# Patient Record
Sex: Female | Born: 1955 | Race: White | Hispanic: No | Marital: Married | Smoking: Never smoker
Health system: Southern US, Community
[De-identification: ages and names within clinical notes are randomized; demographics above are authoritative.]

## PROBLEM LIST (undated history)

## (undated) DIAGNOSIS — R079 Chest pain, unspecified: Secondary | ICD-10-CM

## (undated) DIAGNOSIS — Z9889 Other specified postprocedural states: Secondary | ICD-10-CM

## (undated) DIAGNOSIS — M797 Fibromyalgia: Secondary | ICD-10-CM

## (undated) DIAGNOSIS — E559 Vitamin D deficiency, unspecified: Secondary | ICD-10-CM

## (undated) DIAGNOSIS — G43909 Migraine, unspecified, not intractable, without status migrainosus: Secondary | ICD-10-CM

## (undated) DIAGNOSIS — R112 Nausea with vomiting, unspecified: Secondary | ICD-10-CM

## (undated) DIAGNOSIS — M810 Age-related osteoporosis without current pathological fracture: Secondary | ICD-10-CM

## (undated) DIAGNOSIS — F909 Attention-deficit hyperactivity disorder, unspecified type: Secondary | ICD-10-CM

## (undated) DIAGNOSIS — F419 Anxiety disorder, unspecified: Secondary | ICD-10-CM

## (undated) DIAGNOSIS — D649 Anemia, unspecified: Secondary | ICD-10-CM

## (undated) HISTORY — DX: Anxiety disorder, unspecified: F41.9

## (undated) HISTORY — PX: APPENDECTOMY: SHX54

## (undated) HISTORY — PX: BREAST BIOPSY: SHX20

## (undated) HISTORY — DX: Chest pain, unspecified: R07.9

---
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
## 1988-01-14 HISTORY — PX: REDUCTION MAMMAPLASTY: SUR839

## 1998-03-20 ENCOUNTER — Other Ambulatory Visit: Admission: RE | Admit: 1998-03-20 | Discharge: 1998-03-20 | Payer: Self-pay | Admitting: Obstetrics and Gynecology

## 1998-06-14 ENCOUNTER — Encounter: Payer: Self-pay | Admitting: Emergency Medicine

## 1998-06-14 ENCOUNTER — Emergency Department (HOSPITAL_COMMUNITY): Admission: EM | Admit: 1998-06-14 | Discharge: 1998-06-14 | Payer: Self-pay | Admitting: Emergency Medicine

## 2000-08-06 ENCOUNTER — Other Ambulatory Visit: Admission: RE | Admit: 2000-08-06 | Discharge: 2000-08-06 | Payer: Self-pay | Admitting: Obstetrics and Gynecology

## 2002-05-10 ENCOUNTER — Encounter: Payer: Self-pay | Admitting: Family Medicine

## 2002-05-10 ENCOUNTER — Ambulatory Visit (HOSPITAL_COMMUNITY): Admission: RE | Admit: 2002-05-10 | Discharge: 2002-05-10 | Payer: Self-pay | Admitting: Family Medicine

## 2002-06-27 ENCOUNTER — Encounter: Admission: RE | Admit: 2002-06-27 | Discharge: 2002-06-27 | Payer: Self-pay | Admitting: Obstetrics and Gynecology

## 2002-06-27 ENCOUNTER — Encounter: Payer: Self-pay | Admitting: Obstetrics and Gynecology

## 2002-08-17 ENCOUNTER — Other Ambulatory Visit: Admission: RE | Admit: 2002-08-17 | Discharge: 2002-08-17 | Payer: Self-pay | Admitting: Obstetrics and Gynecology

## 2002-12-21 ENCOUNTER — Ambulatory Visit (HOSPITAL_COMMUNITY): Admission: RE | Admit: 2002-12-21 | Discharge: 2002-12-21 | Payer: Self-pay | Admitting: Family Medicine

## 2002-12-22 ENCOUNTER — Encounter: Admission: RE | Admit: 2002-12-22 | Discharge: 2002-12-22 | Payer: Self-pay | Admitting: Obstetrics and Gynecology

## 2003-09-30 ENCOUNTER — Inpatient Hospital Stay (HOSPITAL_COMMUNITY): Admission: EM | Admit: 2003-09-30 | Discharge: 2003-10-03 | Payer: Self-pay | Admitting: Emergency Medicine

## 2004-01-03 ENCOUNTER — Other Ambulatory Visit: Admission: RE | Admit: 2004-01-03 | Discharge: 2004-01-03 | Payer: Self-pay | Admitting: Family Medicine

## 2004-11-26 ENCOUNTER — Ambulatory Visit: Admission: RE | Admit: 2004-11-26 | Discharge: 2004-11-26 | Payer: Self-pay | Admitting: Family Medicine

## 2005-05-08 ENCOUNTER — Ambulatory Visit: Payer: Self-pay | Admitting: Cardiology

## 2005-05-20 ENCOUNTER — Ambulatory Visit: Payer: Self-pay

## 2005-05-22 ENCOUNTER — Ambulatory Visit: Payer: Self-pay | Admitting: Cardiology

## 2006-01-13 HISTORY — PX: ABDOMINAL HYSTERECTOMY: SHX81

## 2006-01-19 ENCOUNTER — Other Ambulatory Visit: Admission: RE | Admit: 2006-01-19 | Discharge: 2006-01-19 | Payer: Self-pay | Admitting: Family Medicine

## 2006-01-20 ENCOUNTER — Encounter: Admission: RE | Admit: 2006-01-20 | Discharge: 2006-01-20 | Payer: Self-pay | Admitting: Family Medicine

## 2008-03-21 ENCOUNTER — Encounter: Admission: RE | Admit: 2008-03-21 | Discharge: 2008-03-21 | Payer: Self-pay | Admitting: Family Medicine

## 2009-01-13 HISTORY — PX: FINGER SURGERY: SHX640

## 2009-01-26 ENCOUNTER — Encounter: Admission: RE | Admit: 2009-01-26 | Discharge: 2009-01-26 | Payer: Self-pay | Admitting: Family Medicine

## 2009-02-21 ENCOUNTER — Ambulatory Visit (HOSPITAL_COMMUNITY): Admission: EM | Admit: 2009-02-21 | Discharge: 2009-02-22 | Payer: Self-pay | Admitting: Emergency Medicine

## 2009-04-10 ENCOUNTER — Encounter: Admission: RE | Admit: 2009-04-10 | Discharge: 2009-04-10 | Payer: Self-pay | Admitting: Family Medicine

## 2009-06-29 ENCOUNTER — Ambulatory Visit (HOSPITAL_COMMUNITY): Admission: RE | Admit: 2009-06-29 | Discharge: 2009-06-29 | Payer: Self-pay | Admitting: Gastroenterology

## 2010-05-31 NOTE — H&P (Signed)
[name], [name]                 ACCOUNT NO.:  [id number]   MEDICAL RECORD NO.:  [id number]          PATIENT TYPE:  INP   LOCATION:  0440                         FACILITY:  [facility]   PHYSICIAN:  Melissa L. Ladona Ridgel, MD  DATE OF BIRTH:  04-02-1955   DATE OF ADMISSION:  09/30/2003  DATE OF DISCHARGE:                                HISTORY & PHYSICAL   CHIEF COMPLAINT:  Abdominal pain since 3 a.m.   PRIMARY CARE PHYSICIAN:  Dr. Merri Brunette.   HISTORY OF PRESENT ILLNESS:  The patient is a 55 year old white female who  was awakened at 3 a.m. with severe abdominal pain.  She states that the pain  comes in waves.  She could not give me a number but identified it as  starting in the central part of her abdomen and extending up into her chest.  She stated that nothing exacerbated it and nothing relieved it.  The patient  relates that prior to developing the pain she ate at [facility].  She states  she had a hamburger, and fried onion rings.  She states that she has had no  seafood recently, no ill contacts and no sick pet contacts.  The patient  does teach first grade.   PAST MEDICAL HISTORY:  She states she has hormonal problems for which she  takes Wellbutrin.   PAST SURGICAL HISTORY:  She had a hysterectomy eight years prior and this  was secondary to endometriosis.  She has also had an appendectomy.   ALLERGIES:  Codeine which cause hives.   MEDICATIONS:  Her only medications at this time is Wellbutrin.  The dose is  unknown but she does get it from [facility] which is currently closed.   SOCIAL HISTORY:  She denies tobacco, ethanol or illicit drug use.   FAMILY HISTORY:  Her Mom is living with possible diverticular disease. Her  Dad is deceased secondary to MI.  She is married, has a 25 and a 54 year old  and again she teaches school for the first grade.   REVIEW OF SYSTEMS:  Prior to the admission, no fever, chills.  She did have  some nausea but no vomiting.  However, on  arrival into the emergency  department, she was found to be febrile.  She vomited x1 after drinking the  contrast for CT scan.  She denies any chest pain, shortness of breath,  melena, hematochezia, dysuria or blood in her urine.  All other review of  systems are negative.   PHYSICAL EXAMINATION:  VITAL SIGNS:  Temperature is 102.4, blood pressure is  105/49, pulse was 100 and respiratory rate 18, saturations was 97%.  GENERAL:  She is in minimal distress secondary to nausea.  HEENT:  She is  normocephalic, atraumatic.  Pupils equal, round, reactive to light.  Extraocular muscles are intact.  Mucous membranes are moist.  NECK:  Supple.  There is no JVD.  No lymph nodes.  There are no oral lesions.  CHEST:  Clear  to auscultation.  There is no rhonchi, rales or wheezing.  CARDIOVASCULAR:  There is a regular rate  and rhythm, positive S1, S2.  No S3, S4.  No  murmurs, rubs or gallops.  ABDOMEN:  Soft, minimally tender in the  epigastrium with no guarding or rebound.  EXTREMITIES:  Shows 2+ pulses with  no edema.  NEUROLOGICAL:  She is nonfocal.   LABORATORY DATA:  Laboratories reveal a urinalysis with trace leukocyte  esterase.  Her white count is 12.4, hemoglobin of 13.6, hematocrit of 40.2  and platelets of 249,000.  She has 86% neutrophilia.  Liver function tests  were within normal limits.  Amylase and lipase were within normal limits.  Her sodium is 135, potassium is 3.7, chloride is 104, CO2 is 25, BUN is 12,  creatinine is 1.  Her glucose is 97.  CT scan of the abdomen showed mildly  thickened bowel wall.  In the small bowel, ultrasound showed preliminarily  showed no gallbladder disease.   ASSESSMENT AND PLAN:  This is a 55 year old white female with the acute  onset of abdominal pain which had progressed on to fever with worsening  abdominal pain and one episode of nausea with vomiting.  She will be  admitted to the general medical floor and treated for what appears to be a   colitis of unknown etiology on her CT scan.  1.  Gastrointestinal.  She can have clears if this is tolerated.  However,      at this time n.p.o. status would be recommended.  We will continue her      Cipro IV and aggressively hydrate her.  We will also be checking stool      cultures.  2.  Genitourinary.  There is trace leukocyte esterase in her urine.  This      will be covered by the Cipro.  Will continue to IV hydrate and await her      formal cultures.  3.  Pulmonary.  Stable.  4.  Cardiovascular.  She does have some tachycardia but this is related with      fever.  I will attempt to check an EKG just to assure that this remains      sinus tachycardia.  5.  Psychiatric.  The patient states that she has some hormonal problems for      which she is on Wellbutrin.  We will hold that at this time and resume      it when she is able to take oral medications.  6.  For deep venous thrombosis prophylaxis, at this time the patient can      ambulate.     Meli   MLT/MEDQ  D:  10/02/2003  T:  10/02/2003  Job:  [id number]

## 2010-05-31 NOTE — Discharge Summary (Signed)
[name], [name]                 ACCOUNT NO.:  [id number]   MEDICAL RECORD NO.:  [id number]          PATIENT TYPE:  INP   LOCATION:  0440                         FACILITY:  [facility]   PHYSICIAN:  Corinna L. Lendell Caprice, MDDATE OF BIRTH:  08/04/1955   DATE OF ADMISSION:  09/30/2003  DATE OF DISCHARGE:  10/03/2003                                 DISCHARGE SUMMARY   DIAGNOSES:  1.  Enteritis, suspect infectious.  2.  Acute cephalgia.   DISCHARGE MEDICATIONS:  Cipro 500 mg p.o. b.i.d. for two more days.   ACTIVITY:  She may return to work in three days.   DIET:  As tolerated.   CONDITION ON DISCHARGE:  Stable.   Follow up with Dr. Katrinka Blazing as needed.   CONSULTATIONS:  None.   PROCEDURES:  None.   PERTINENT LABORATORY DATA:  Blood cultures negative.  UA negative.  Complete  metabolic panel negative.  Tylenol level less than 10.  White blood cell  count on admission was 12.4 with 86% neutrophils, 6% lymphocytes.  The rest  of the CBC was unremarkable.  At discharge her white count was 4.4.   SPECIAL STUDIES AND RADIOLOGY:  Ultrasound of the abdomen was negative.  CT  of the abdomen and pelvis showed thickening of the distal small bowel loops  with adjacent mesenteric inflammation, nonspecific, consider Crohn's versus  infectious enteritis versus endometriosis.  Small 9 mm paracecal lymph node  representing reactive lymph node.  Previous hysterectomy.   HISTORY AND HOSPITAL COURSE:  Ms. Darsey is a pleasant 55 year old white  female patient of Dr. Merri Brunette, who presented to the emergency room  with severe abdominal pain.  It was starting in the midabdomen, extending  upward.  She had had hamburger recently.  She had a temperature of 102.4,  pulse was 100, blood pressure 105/49, respiratory rate 18.  The patient's  abdominal exam was soft, no guarding or rebound.  She had some minimal  epigastric tenderness.   The patient was started empirically on IV Cipro.  She was made NPO,  given  pain medications and antipyretics.  Her workup was negative for gallbladder  disease.  Her fever resolved, as did her pain, while on empiric  ciprofloxacin,  her diet was advanced, and she had normal vital signs, normal labs, was  feeling much better and tolerating a regular diet at the time of discharge.  This is most likely infectious; however, if she has a recurrence, consider  endoscopy to rule out Crohn's disease.     Cori   CLS/MEDQ  D:  10/03/2003  T:  10/04/2003  Job:  [id number]   cc:   Dario Guardian, M.D.  [address]  [place]  [place] [address]  Fax: [phone]

## 2010-07-16 ENCOUNTER — Other Ambulatory Visit: Payer: Self-pay | Admitting: Family Medicine

## 2010-07-16 ENCOUNTER — Other Ambulatory Visit (HOSPITAL_COMMUNITY)
Admission: RE | Admit: 2010-07-16 | Discharge: 2010-07-16 | Disposition: A | Discharge status: Home / Self Care | Payer: BC Managed Care – PPO | Source: Ambulatory Visit | Attending: Family Medicine | Admitting: Family Medicine

## 2010-07-16 DIAGNOSIS — Z01419 Encounter for gynecological examination (general) (routine) without abnormal findings: Secondary | ICD-10-CM | POA: Insufficient documentation

## 2010-08-26 ENCOUNTER — Ambulatory Visit
Admission: RE | Admit: 2010-08-26 | Discharge: 2010-08-26 | Disposition: A | Discharge status: Home / Self Care | Payer: BC Managed Care – PPO | Source: Ambulatory Visit | Attending: Family Medicine | Admitting: Family Medicine

## 2010-08-26 ENCOUNTER — Other Ambulatory Visit: Payer: Self-pay | Admitting: Family Medicine

## 2010-08-26 DIAGNOSIS — R51 Headache: Secondary | ICD-10-CM

## 2011-04-23 ENCOUNTER — Encounter (HOSPITAL_COMMUNITY): Payer: Self-pay | Admitting: *Deleted

## 2011-04-23 ENCOUNTER — Emergency Department (HOSPITAL_COMMUNITY): Payer: BC Managed Care – PPO

## 2011-04-23 ENCOUNTER — Observation Stay (HOSPITAL_COMMUNITY)
Admission: EM | Admit: 2011-04-23 | Discharge: 2011-04-24 | Disposition: A | Discharge status: Home / Self Care | Payer: BC Managed Care – PPO | Source: Ambulatory Visit | Attending: Internal Medicine | Admitting: Internal Medicine

## 2011-04-23 DIAGNOSIS — IMO0001 Reserved for inherently not codable concepts without codable children: Secondary | ICD-10-CM | POA: Insufficient documentation

## 2011-04-23 DIAGNOSIS — R079 Chest pain, unspecified: Principal | ICD-10-CM | POA: Insufficient documentation

## 2011-04-23 DIAGNOSIS — F411 Generalized anxiety disorder: Secondary | ICD-10-CM | POA: Insufficient documentation

## 2011-04-23 DIAGNOSIS — F419 Anxiety disorder, unspecified: Secondary | ICD-10-CM

## 2011-04-23 HISTORY — DX: Migraine, unspecified, not intractable, without status migrainosus: G43.909

## 2011-04-23 HISTORY — DX: Anemia, unspecified: D64.9

## 2011-04-23 HISTORY — DX: Vitamin D deficiency, unspecified: E55.9

## 2011-04-23 HISTORY — DX: Fibromyalgia: M79.7

## 2011-04-23 HISTORY — DX: Other specified postprocedural states: Z98.890

## 2011-04-23 HISTORY — DX: Attention-deficit hyperactivity disorder, unspecified type: F90.9

## 2011-04-23 HISTORY — DX: Other specified postprocedural states: R11.2

## 2011-04-23 LAB — COMPREHENSIVE METABOLIC PANEL
AST: 16 U/L (ref 0–37)
Alkaline Phosphatase: 66 U/L (ref 39–117)
CO2: 25 mEq/L (ref 19–32)
Calcium: 9.4 mg/dL (ref 8.4–10.5)
Chloride: 106 mEq/L (ref 96–112)
Creatinine, Ser: 0.78 mg/dL (ref 0.50–1.10)
Glucose, Bld: 88 mg/dL (ref 70–99)
Potassium: 3.6 mEq/L (ref 3.5–5.1)
Total Protein: 6.6 g/dL (ref 6.0–8.3)

## 2011-04-23 LAB — CBC
HCT: 35.4 % — ABNORMAL LOW (ref 36.0–46.0)
Hemoglobin: 12 g/dL (ref 12.0–15.0)
MCH: 31 pg (ref 26.0–34.0)
MCV: 91.5 fL (ref 78.0–100.0)
Platelets: 212 10*3/uL (ref 150–400)
RBC: 3.87 MIL/uL (ref 3.87–5.11)
RDW: 12.1 % (ref 11.5–15.5)

## 2011-04-23 LAB — CARDIAC PANEL(CRET KIN+CKTOT+MB+TROPI): Relative Index: INVALID (ref 0.0–2.5)

## 2011-04-23 LAB — PROTIME-INR: INR: 1.06 (ref 0.00–1.49)

## 2011-04-23 LAB — DIFFERENTIAL
Eosinophils Absolute: 0.1 10*3/uL (ref 0.0–0.7)
Monocytes Absolute: 0.5 10*3/uL (ref 0.1–1.0)

## 2011-04-23 MED ORDER — AMPHETAMINE-DEXTROAMPHETAMINE 10 MG PO TABS: 10.0000 mg | ORAL_TABLET | Freq: Every day | ORAL | Status: DC

## 2011-04-23 MED ORDER — NITROGLYCERIN 2 % TD OINT
1.0000 [in_us] | TOPICAL_OINTMENT | Freq: Once | TRANSDERMAL | Status: AC
  Administered 2011-04-23: 1 [in_us] via TOPICAL
  Filled 2011-04-23: qty 1

## 2011-04-23 MED ORDER — ENOXAPARIN SODIUM 40 MG/0.4ML ~~LOC~~ SOLN
40.0000 mg | SUBCUTANEOUS | Status: DC
  Administered 2011-04-23: 40 mg via SUBCUTANEOUS
  Filled 2011-04-23 (×3): qty 0.4

## 2011-04-23 MED ORDER — ALPRAZOLAM 0.25 MG PO TABS
0.2500 mg | ORAL_TABLET | Freq: Two times a day (BID) | ORAL | Status: DC | PRN
  Administered 2011-04-23: 0.25 mg via ORAL
  Filled 2011-04-23: qty 1

## 2011-04-23 MED ORDER — ZOLPIDEM TARTRATE 5 MG PO TABS
2.5000 mg | ORAL_TABLET | Freq: Every day | ORAL | Status: DC
  Administered 2011-04-23: 2.5 mg via ORAL
  Filled 2011-04-23: qty 1

## 2011-04-23 MED ORDER — SODIUM CHLORIDE 0.9 % IV SOLN: 250.0000 mL | INTRAVENOUS | Status: DC | PRN

## 2011-04-23 MED ORDER — ACETAMINOPHEN 325 MG PO TABS
650.0000 mg | ORAL_TABLET | ORAL | Status: DC | PRN
  Administered 2011-04-23: 650 mg via ORAL
  Filled 2011-04-23: qty 2

## 2011-04-23 MED ORDER — NITROGLYCERIN 0.4 MG SL SUBL: 0.4000 mg | SUBLINGUAL_TABLET | SUBLINGUAL | Status: DC | PRN

## 2011-04-23 MED ORDER — SODIUM CHLORIDE 0.9 % IJ SOLN
3.0000 mL | Freq: Two times a day (BID) | INTRAMUSCULAR | Status: DC
  Administered 2011-04-23: 3 mL via INTRAVENOUS

## 2011-04-23 MED ORDER — ONDANSETRON HCL 4 MG/2ML IJ SOLN
4.0000 mg | Freq: Four times a day (QID) | INTRAMUSCULAR | Status: DC | PRN
  Administered 2011-04-23: 4 mg via INTRAVENOUS
  Filled 2011-04-23: qty 2

## 2011-04-23 MED ORDER — SODIUM CHLORIDE 0.9 % IJ SOLN: 3.0000 mL | INTRAMUSCULAR | Status: DC | PRN

## 2011-04-23 MED ORDER — MORPHINE SULFATE 2 MG/ML IJ SOLN
1.0000 mg | INTRAMUSCULAR | Status: DC | PRN
  Administered 2011-04-23: 1 mg via INTRAVENOUS
  Filled 2011-04-23: qty 1

## 2011-04-23 MED ORDER — CHOLECALCIFEROL 10 MCG (400 UNIT) PO TABS
5000.0000 [IU] | ORAL_TABLET | Freq: Every day | ORAL | Status: DC
  Filled 2011-04-23 (×3): qty 13

## 2011-04-23 NOTE — ED Notes (Signed)
Pt c/o mild headache. Pt requested tylenol.

## 2011-04-23 NOTE — ED Notes (Signed)
Nitro patch removed per MD

## 2011-04-23 NOTE — ED Notes (Signed)
EMS-pt went to [facility] cardiology today for left sided chest pain radiating into shoulder and neck x 4 days, pt states pain constant but increases and decreases in severity. 324asa and 2nitros given en route. Pt states pain from 7/10- to 4/10 with 2 nitros. 20g(L)AC. Pt sent over for further eval.

## 2011-04-23 NOTE — ED Notes (Signed)
Attempted calling report to floor, nurse in report, will return call.

## 2011-04-23 NOTE — ED Provider Notes (Signed)
History     CSN: [id number]  Arrival date & time 04/23/11  1430   First MD Initiated Contact with Patient 04/23/11 1504      Chief Complaint  Patient presents with  . Chest Pain    (Consider location/radiation/quality/duration/timing/severity/associated sxs/prior treatment) HPI  Patient presents via EMS from her doctor's office.  Patient relates for the past 5-7 days she's been having a left-sided chest pressure that radiates into her left arm down to her elbow, up into her left neck, and into her back. She states the pain has been intermittent and lasting for several hours. She states this morning she woke up with the pain. She denies nausea, vomiting, diaphoresis, shortness of breath. She states nothing makes it feel better, nothing makes it feel worse. She went to her doctor's office today and she was given nitroglycerin x2 which improved her pain from a level 7/10 to a 2/10 currently. She also was given 4 baby aspirin to chew. She states prior to this past week she's never had these symptoms before. She denies being under any extra stress. She denies any change in her activity.  PCP Dr Erby Pian  Past Medical History  Diagnosis Date  . Migraine headache   . Anemia   . ADHD (attention deficit hyperactivity disorder)   . Fibromyalgia   . Vitamin D insufficiency     Past Surgical History  Procedure Date  . Tonsillectomy   . Appendectomy   . Breast surgery     reduction  . Abdominal hysterectomy     History reviewed. No pertinent family history.  History  Substance Use Topics  . Smoking status: Never Smoker   . Smokeless tobacco: Not on file  . 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    Allergies  Codeine  Home Medications   Current Outpatient Rx  Name Route Sig Dispense Refill  .  AMPHETAMINE-DEXTROAMPHETAMINE 10 MG PO TABS Oral Take 10 mg by mouth daily.    Marland Kitchen VITAMIN D (CHOLECALCIFEROL) PO Oral Take 5,000 Units by mouth daily.    Marland Kitchen ZOLPIDEM TARTRATE 5 MG PO TABS Oral Take 2.5 mg by mouth at bedtime.      BP 128/74  Pulse 82  Temp(Src) 97.4 F (36.3 C) (Oral)  Resp 16  SpO2 100%  Vital signs normal    Physical Exam  Nursing note and vitals reviewed. Constitutional: She is oriented to person, place, and time. She appears well-developed and well-nourished.  Non-toxic appearance. She does not appear ill. No distress.  HENT:  Head: Normocephalic and atraumatic.  Right Ear: External ear normal.  Left Ear: External ear normal.  Nose: Nose normal. No mucosal edema or rhinorrhea.  Mouth/Throat: Oropharynx is clear and moist and mucous membranes are normal. No dental abscesses or uvula swelling.  Eyes: Conjunctivae and EOM are normal. Pupils are equal, round, and reactive to light.  Neck: Normal range of motion and full passive range of motion without pain. Neck supple.  Cardiovascular: Normal rate, regular rhythm and normal heart sounds.  Exam reveals no gallop and no friction rub.   No murmur heard. Pulmonary/Chest: Effort normal and breath sounds normal. No respiratory distress. She has no wheezes. She has no rhonchi. She has  no rales. She exhibits no tenderness and no crepitus.  Abdominal: Soft. Normal appearance and bowel sounds are normal. She exhibits no distension. There is no tenderness. There is no rebound and no guarding.  Musculoskeletal: Normal range of motion. She exhibits no edema and no tenderness.       Moves all extremities well.   Neurological: She is alert and oriented to person, place, and time. She has normal strength. No cranial nerve deficit.  Skin: Skin is warm, dry and intact. No rash noted. No erythema. No pallor.  Psychiatric: She has a normal mood and affect. Her speech is normal and behavior is normal. Her mood appears not anxious.     ED Course  Procedures (including critical care time)   Medications  nitroGLYCERIN (NITROGLYN) 2 % ointment 1 inch (1 inch Topical Given 04/23/11 1536)   Recheck 16:30 states her pain isn't gone.   16:31 Pt and husband states she saw Dr Myrtis Ser in 2007 for "inflammation". States she had a stress test.   16:43 Dr Ladona Ridgel admit to observation   Results for orders placed during the hospital encounter of 04/23/11  CBC      Component Value Range   WBC 7.4  4.0 - 10.5 (K/uL)   RBC 3.87  3.87 - 5.11 (MIL/uL)   Hemoglobin 12.0  12.0 - 15.0 (g/dL)   HCT 09.8 (*) 11.9 - 46.0 (%)   MCV 91.5  78.0 - 100.0 (fL)   MCH 31.0  26.0 - 34.0 (pg)   MCHC 33.9  30.0 - 36.0 (g/dL)   RDW 14.7  82.9 - 56.2 (%)   Platelets 212  150 - 400 (K/uL)  DIFFERENTIAL      Component Value Range   Neutrophils Relative 71  43 - 77 (%)   Lymphocytes Relative 21  12 - 46 (%)   Monocytes Relative 7  3 - 12 (%)   Eosinophils Relative 1  0 - 5 (%)   Basophils Relative 0  0 - 1 (%)   Neutro Abs 5.2  1.7 - 7.7 (K/uL)   Lymphs Abs 1.6  0.7 - 4.0 (K/uL)   Monocytes Absolute 0.5  0.1 - 1.0 (K/uL)   Eosinophils Absolute 0.1  0.0 - 0.7 (K/uL)   Basophils Absolute 0.0  0.0 - 0.1 (K/uL)   Smear Review MORPHOLOGY UNREMARKABLE    COMPREHENSIVE METABOLIC PANEL      Component Value Range   Sodium 142  135 - 145 (mEq/L)   Potassium 3.6  3.5 - 5.1 (mEq/L)   Chloride 106  96 - 112 (mEq/L)   CO2 25  19 - 32 (mEq/L)   Glucose, Bld 88  70 - 99 (mg/dL)   BUN 18  6 - 23 (mg/dL)   Creatinine, Ser 1.30  0.50 - 1.10 (mg/dL)   Calcium 9.4  8.4 - 86.5 (mg/dL)   Total Protein 6.6  6.0 - 8.3 (g/dL)   Albumin 4.0  3.5 - 5.2 (g/dL)   AST 16  0 - 37 (U/L)   ALT 10  0 - 35 (U/L)   Alkaline Phosphatase 66  39 - 117 (U/L)   Total Bilirubin 0.4  0.3 - 1.2 (mg/dL)   GFR calc non Af Amer >90  >90 (mL/min)   GFR calc Af Amer >90  >90 (mL/min)  D-DIMER, QUANTITATIVE      Component Value Range   D-Dimer, Quant 0.28  0.00 - 0.48 (ug/mL-FEU)   APTT      Component Value Range  aPTT 32  24 - 37 (seconds)  PROTIME-INR      Component Value Range   Prothrombin Time 14.0  11.6 - 15.2 (seconds)   INR 1.06  0.00 - 1.49   POCT I-STAT TROPONIN I      Component Value Range   Troponin i, poc 0.00  0.00 - 0.08 (ng/mL)   Comment 3            Laboratory interpretation all normal  Dg Chest Port 1 View  04/23/2011  *RADIOLOGY REPORT*  Clinical Data: Chest pain.  PORTABLE CHEST - 1 VIEW  Comparison: 03/21/2008  Findings: Low lung volumes with bibasilar atelectasis.  Heart is normal size.  No effusions.  No acute bony abnormality.  IMPRESSION: Bibasilar atelectasis.  Original Report Authenticated By: Cyndie Chime, M.D.  .    Date: 04/23/2011  Rate: 79  Rhythm: normal sinus rhythm and premature ventricular contractions (PVC)  QRS Axis: normal  Intervals: normal  ST/T Wave abnormalities: normal  Conduction Disutrbances:none  Narrative Interpretation: PRWP  Old EKG Reviewed: none available    1. Chest pain     Plan admission to observation   Devoria Albe, MD, FACEP   MDM          Ward Givens, MD 04/25/11 1730

## 2011-04-23 NOTE — Progress Notes (Signed)
Pt. Complained of pressure in her chest, radiating to her left upper back, pain 6/10. Pt also had an headache 7/10 from nitro paste removed earlier while in ED. EKG was done, VS stable. B/P 133/79, HR 77, O2 sats 100 on RA. Pt. Was given xanax for anxiety. MD on all notified and gave order to give morphine 1mg  prn, order carried out. Pt now resting in bed and states she feels better, rating pain 2/10, which she says is what it usually is. Charge nurse notified, will continue to monitor patient.

## 2011-04-23 NOTE — Progress Notes (Signed)
Pt reports chest pain 0/10, with relief. Will continue to monitor.

## 2011-04-23 NOTE — Consult Note (Signed)
Cardiology Consult Note   Patient ID: [name] MRN: [id number], DOB/AGE: 03-05-1955   Admit date: 04/23/2011 Date of Consult: 04/23/2011  Primary Physician: Allean Found, MD, MD Primary Cardiologist: Jerral Bonito, MD (seen in 2007)  Pt. Profile: [name] is a 56yo Caucasian female with no prior cardiac history and PMHx significant for anxiety, family history of CAD and migraines who presents to [facility] ED for chest pain.   Patient had an exercise Myoview in 2007 without evidence of ischemia.   Reason for consult: evaluation/management of chest pain  Problem List: Past Medical History  Diagnosis Date  . Migraine headache   . Anemia   . ADHD (attention deficit hyperactivity disorder)   . Fibromyalgia   . Vitamin D insufficiency     Past Surgical History  Procedure Date  . Tonsillectomy   . Appendectomy   . Breast surgery     reduction  . Abdominal hysterectomy      Allergies:  Allergies  Allergen Reactions  . Codeine Other (See Comments)    hallucinations    HPI:   She reports experiencing L-sided chest pain five days ago described as "pressure" and fullness and also sharp pain, intermittent lasting for hours at a time, increasing in frequency and radiating to L shoulder, arm and neck. She endorses palpitations. Occuring mostly at rest throughout the day. No aggravating factors. No alleviating factors. Denied chest pain after eating. She denies diaphoresis, lightheadedness, shortness of breath, DOE, recent decrease in activity level, orthopnea, PND, LE edema, fevers, n/v, bowel or bladder changes, chills, cough, new stressors, sick contacts or extended travel.   Upon ED arrival, EKG reveals sinus rhythm without ischemic changes. POC TnI WNL. CXR with mild bibasilar atelectasis, otherwise no acute abnormalities noted. VSS. CBC and BMET essentially WNL. D-dimer WNL. She was given NTG SL with improvement in her chest pain. She was started on NTG transdermal with  minimal effect. She will be transferred to observation per chest pain protocol.  Home Medications: Prior to Admission medications   Medication Sig Start Date End Date Taking? Authorizing Provider  amphetamine-dextroamphetamine (ADDERALL) 10 MG tablet Take 10 mg by mouth daily.   Yes Historical Provider, MD  VITAMIN D, CHOLECALCIFEROL, PO Take 5,000 Units by mouth daily.   Yes Historical Provider, MD  zolpidem (AMBIEN) 5 MG tablet Take 2.5 mg by mouth at bedtime.   Yes Historical Provider, MD    Inpatient Medications:     . nitroGLYCERIN  1 inch Topical Once    (Not in a hospital admission)  Family History  Problem Relation Age of Onset  . Heart attack Father 13  . Heart attack Paternal Grandfather 50  . Arrhythmia Mother     Atrial fibrillation     History   Social History  . Marital Status: Married    Spouse Name: N/A    Number of Children: 2  . Years of Education: N/A   Occupational History  . Teacher    Social History Main Topics  . Smoking status: Never Smoker   . Smokeless tobacco: Not on file  . Alcohol Use: No  . Drug Use: No  . Sexually Active: Not on file   Other Topics Concern  . Not on file   Social History Narrative   Lives in [place], [place] with husband.      Review of Systems: General: negative for chills, fever, night sweats or weight changes.  Cardiovascular: positive for chest pain, palpitations, negative dyspnea on exertion, edema,  orthopnea, paroxysmal nocturnal dyspnea or shortness of breath Dermatological: negative for rash Respiratory: negative for cough or wheezing Urologic: negative for hematuria Abdominal: negative for nausea, vomiting, diarrhea, bright red blood per rectum, melena, or hematemesis Neurologic: negative for visual changes, syncope, or dizziness All other systems reviewed and are otherwise negative except as noted above.  Physical Exam: Blood pressure 128/74, pulse 82, temperature 97.4 F (36.3 C), temperature  source Oral, resp. rate 16, SpO2 100.00%.   General: Well developed, well nourished, in no acute distress. Head: Normocephalic, atraumatic, sclera non-icteric, no xanthomas, nares are without discharge.  Neck: Negative for carotid bruits. JVD not elevated. Lungs: Clear bilaterally to auscultation without wheezes, rales, or rhonchi. Breathing is unlabored. Heart: RRR with S1 S2. No murmurs, rubs, or gallops appreciated. Abdomen: Soft, non-tender, non-distended with normoactive bowel sounds. No hepatomegaly. No rebound/guarding. No obvious abdominal masses. Msk:  Strength and tone appears normal for age. Extremities: No clubbing, cyanosis or edema.  Distal pedal pulses are 2+ and equal bilaterally. Neuro: Alert and oriented X 3. Moves all extremities spontaneously. Psych:  Responds to questions appropriately with a normal affect.  Labs: Recent Labs  Basename 04/23/11 1519   WBC 7.4   HGB 12.0   HCT 35.4*   MCV 91.5   PLT 212    Recent Labs  Basename 04/23/11 1519   DDIMER 0.28    Lab 04/23/11 1519  NA 142  K 3.6  CL 106  CO2 25  BUN 18  CREATININE 0.78  CALCIUM 9.4  PROT 6.6  BILITOT 0.4  ALKPHOS 66  ALT 10  AST 16  AMYLASE --  LIPASE --  GLUCOSE 88    Radiology/Studies: Dg Chest Port 1 View  04/23/2011  *RADIOLOGY REPORT*  Clinical Data: Chest pain.  PORTABLE CHEST - 1 VIEW  Comparison: 03/21/2008  Findings: Low lung volumes with bibasilar atelectasis.  Heart is normal size.  No effusions.  No acute bony abnormality.  IMPRESSION: Bibasilar atelectasis.  Original Report Authenticated By: Cyndie Chime, M.D.    EKG: NSR, 79 bpm, occasional PVCs, no ST-T wave changes  ASSESSMENT AND PLAN:   [name] is a 56yo Caucasian female with no prior cardiac history and PMHx significant for anxiety, family history of CAD and migraines who presents to [facility] ED for chest pain.   1. Chest pain- atypical for cardiac source. Patient had a normal exercise Myoview in 2007. She does  have a family history of CAD. She reports intermittent L-sided chest pain described as pressure and sharp radiating to her L shoulder and arm for the past week, nonexertional, with associated palpitations. No prior episodes of chest pain, DOE or decrease in activity level. EKG without ischemic changes. Initial POC TnI WNL. Pain was relieved with NTG SL. The patient has one cardiac risk factor in her family history. She has had a normal exercise Myoview in 2007.   - Agree with observation  - Cycle cardiac enzymes  - If rules out, would plan for outpatient stress test. Should this return non-ischemic, may benefit from a trial of a long-acting nitrate or CCB given history of migraines. If continues to have chest pain tomorrow AM, may consider cardiac CT.  Signed, R. Hurman Horn, PA-C 04/23/2011, 5:25 PM    Cardiology Attending  Patient seen and independently examined. I have reviewed the findings of Mr. Arguello's note and concur. She has a h/o hysterectomy at age 55 but no other cardiac risk factors and was admitted with sscp radiating into  the side. No sob or other symptoms. He pain was markedly improved with slntg. She now has a headache. Her exam is unremarkable and ECG and initial labs normal as well. Will admit for a 23 hour observation and r/o for MI. Additional testing depends on the results of her blood work, tele and ECG's.  She will need a treadmill stress test if she rules out. Lewayne Bunting, M.D.

## 2011-04-23 NOTE — ED Notes (Addendum)
Pt c/o left sided cp, shoulder and neck pain x4 days. States pain comes and goes, does not inc with movement or palpation. NAD noted at this time. Pt states that she feels "really good, the nitro helped. I barely feel the pain." Denies sob, dizziness, n/v, diaphoresis

## 2011-04-24 LAB — CARDIAC PANEL(CRET KIN+CKTOT+MB+TROPI)
Relative Index: INVALID (ref 0.0–2.5)
Total CK: 50 U/L (ref 7–177)
Total CK: 39 U/L (ref 7–177)
Troponin I: <0.3 ng/mL (ref <0.30)

## 2011-04-24 LAB — LIPID PANEL: LDL Cholesterol: 115 mg/dL — ABNORMAL HIGH (ref 0–99)

## 2011-04-24 LAB — BASIC METABOLIC PANEL
BUN: 19 mg/dL (ref 6–23)
Calcium: 9 mg/dL (ref 8.4–10.5)
Creatinine, Ser: 0.8 mg/dL (ref 0.50–1.10)
GFR calc non Af Amer: 81 mL/min — ABNORMAL LOW (ref >90)
Glucose, Bld: 127 mg/dL — ABNORMAL HIGH (ref 70–99)

## 2011-04-24 LAB — CBC
HCT: 36.6 % (ref 36.0–46.0)
Hemoglobin: 12.4 g/dL (ref 12.0–15.0)
MCH: 30.9 pg (ref 26.0–34.0)
MCHC: 33.9 g/dL (ref 30.0–36.0)
RDW: 12.1 % (ref 11.5–15.5)

## 2011-04-24 MED ORDER — PROMETHAZINE HCL 25 MG/ML IJ SOLN
12.5000 mg | Freq: Four times a day (QID) | INTRAMUSCULAR | Status: DC | PRN
  Administered 2011-04-24: 12.5 mg via INTRAVENOUS
  Filled 2011-04-24: qty 1

## 2011-04-24 NOTE — Progress Notes (Signed)
UR Completed. Simmons, Avion Kutzer F [phone]  

## 2011-04-24 NOTE — Progress Notes (Signed)
PRN for nausea and vomiting was given at 0210, pt just vomited again but states feels better. Will continue to monitor.

## 2011-04-24 NOTE — Progress Notes (Signed)
Pt discharge instructions and patient education complete. IV site d/c. Site WNL. No s/s of distress. D/C home via wheelchair with husband. Dion Saucier

## 2011-04-24 NOTE — Progress Notes (Signed)
Pt. Vomited, pt received Zofran IV. Pt threw up on two separate occasions after that at 0030 and 0115. MD on call notified, gave order for phenergan IV, order carried out.

## 2011-04-24 NOTE — Discharge Summary (Signed)
CARDIOLOGY DISCHARGE SUMMARY   Patient ID: JONIYA BOBERG MRN: [id number] DOB/AGE: [birth year] 56 y.o.  Admit date: 04/23/2011 Discharge date: 04/24/2011  Primary Discharge Diagnosis:  Chest pain  Secondary Discharge Diagnosis:  Patient Active Problem List  Diagnoses  . Chest pain  . Anxiety   Procedures: CXR  Hospital Course: Ms Belmonte is a 56 year old female with no history of CAD although she has had a previous stress test. She had chest pain and came to the hospital where she was admitted for further evaluation and treatment.   Her cardiac enzymes were negative for MI. Her chest pain improved. She had some nausea but this is a recurrent problem for her and symptomatic treatment was used.   On 04/24/2011, Ms Meadow was ambulating without chest pain or SOB and considered stable for discharge, to follow up as an outpatient.   Labs:   Lab Results  Component Value Date   WBC 6.9 04/24/2011   HGB 12.4 04/24/2011   HCT 36.6 04/24/2011   MCV 91.3 04/24/2011   PLT 167 04/24/2011    Lab 04/24/11 0151 04/23/11 1519  NA 140 --  K 4.1 --  CL 106 --  CO2 28 --  BUN 19 --  CREATININE 0.80 --  CALCIUM 9.0 --  PROT -- 6.6  BILITOT -- 0.4  ALKPHOS -- 66  ALT -- 10  AST -- 16  GLUCOSE 127* --    Basename 04/24/11 0150 04/23/11 2025  CKTOTAL 50 57  CKMB 1.6 1.8  CKMBINDEX -- --  TROPONINI <0.30 <0.30   Lipid Panel     Component Value Date/Time   CHOL 189 04/24/2011 0151   TRIG 84 04/24/2011 0151   HDL 57 04/24/2011 0151   CHOLHDL 3.3 04/24/2011 0151   VLDL 17 04/24/2011 0151   LDLCALC 115* 04/24/2011 0151    No results found for this basename: probnp    Basename 04/23/11 1519  INR 1.06      Radiology: Dg Chest Port 1 View  04/23/2011  *RADIOLOGY REPORT*  Clinical Data: Chest pain.  PORTABLE CHEST - 1 VIEW  Comparison: 03/21/2008  Findings: Low lung volumes with bibasilar atelectasis.  Heart is normal size.  No effusions.  No acute bony abnormality.  IMPRESSION: Bibasilar  atelectasis.  Original Report Authenticated By: Cyndie Chime, M.D.    EKG: 24-Apr-2011 03:12:02   Normal sinus rhythm Normal ECG  Vent. rate 82 BPM PR interval 122 ms QRS duration 86 ms QT/QTc 372/434 ms P-R-T axes 55 -7 31   FOLLOW UP PLANS AND APPOINTMENTS Discharge Orders    Future Appointments: Provider: Department: Dept Phone: Center:   05/01/2011 11:30 AM [facility]  None   05/08/2011 8:30 AM Beatrice Lecher, PA [facility] [phone] [facility]     Allergies  Allergen Reactions  . Codeine Other (See Comments)    hallucinations   Medication List  As of 04/24/2011 10:43 AM   TAKE these medications         amphetamine-dextroamphetamine 10 MG tablet   Commonly known as: ADDERALL   Take 10 mg by mouth daily.      VITAMIN D (CHOLECALCIFEROL) PO   Take 5,000 Units by mouth daily.      zolpidem 5 MG tablet   Commonly known as: AMBIEN   Take 2.5 mg by mouth at bedtime.           Follow-up Information    Follow up with  Allean Found, MD. (As needed)       Follow up with Tereso Newcomer, PA. (April 25th at  8:30)    Contact information:   [address] [place] [address] [phone]          BRING ALL MEDICATIONS WITH YOU TO FOLLOW UP APPOINTMENTS  Time spent with patient to include physician time: 33 min Signed: Theodore Demark 04/24/2011, 10:43 AM Co-Sign MD  Attending Note:   The patient was seen and examined.  Agree with assessment and plan as noted above.  See my note from earlier same day of DC  Alvia Grove., MD, [facility] 04/25/2011, 8:00 AM

## 2011-04-24 NOTE — Discharge Instructions (Signed)
   You have a Stress Test scheduled on April 18th at 11:30 at [facility] No food/drink after midnight before. No caffeine/decaf products 24hr before, including meds such as Excedrin or Goody Powders. Call if there are any questions. OK to take am meds with a sip of water. Arrive about 15 min early for paperwork. Wear comfortable clothes and shoes. Do NOT take: Beta blockers such as metoprolol/lopressor/Toprol XL or calcium channel blockers such as cardizem/Diltiazem or verapmil/Calan for 24 hours before the test.  Remove nitroglycerin patches and do not take nitrate preparations such as Imdur/isosorbide. No Persantine/Theophylline or Aggrenox meds should be used within 24 hours of the test. Discuss with MD what to do about diabetes meds if you take these.  When you arrive in the lab, the technician will inject a small amount of radioactive tracer. After a waiting period, resting pictures will be obtained.   You will be prepped for the stress portion of the test. With the stress (medical or treadmill), another small amount of radioactive tracer will be injected.  You will get a second set of pictures after a waiting period.   The whole test will take several hours.

## 2011-04-24 NOTE — Progress Notes (Signed)
PROGRESS NOTE  Subjective:   [name] is a 9 you with hx of CP - negative myoview in the past.  Admitted with CP. Initial cardiac enzymes are negative.    Objective:    Vital Signs:   Temp:  [97.4 F (36.3 C)-99.4 F (37.4 C)] 99.4 F (37.4 C) (04/11 0403) Pulse Rate:  [73-86] 86  (04/11 0403) Resp:  [16-18] 18  (04/11 0403) BP: (111-133)/(70-79) 120/72 mmHg (04/11 0403) SpO2:  [94 %-100 %] 94 % (04/11 0403) Weight:  [132 lb 4.4 oz (60 kg)] 132 lb 4.4 oz (60 kg) (04/10 2025)  Last BM Date: 04/22/11   24-hour weight change: Weight change:   Weight trends: Filed Weights   04/23/11 2025  Weight: 132 lb 4.4 oz (60 kg)    Intake/Output:        Physical Exam: BP 120/72  Pulse 86  Temp(Src) 99.4 F (37.4 C) (Oral)  Resp 18  Ht 5\' 2"  (1.575 m)  Wt 132 lb 4.4 oz (60 kg)  BMI 24.19 kg/m2  SpO2 94%  General: Vital signs reviewed and noted. Well-developed, well-nourished, in no acute distress; alert, appropriate and cooperative throughout examination.  Head: Normocephalic, atraumatic.  Eyes: conjunctivae/corneas clear. PERRL, EOM's intact. Fundi benign.  Throat: Oropharynx nonerythematous, no exudate appreciated.   Neck: Supple. Normal carotids. No JVD  Lungs:  Clear bilaterally to auscultation without wheezes, rales, or rhonchi. Breathing is unlabored.  Heart: Regular rate,  With normal  S1 S2. No murmurs, rubs, or gallops   Abdomen:  Soft, non-tender, non-distended with normoactive bowel sounds. No hepatomegaly. No rebound/guarding. No abdominal masses.  Extremities: No edema.  Distal pedal pulses are 2+ and equal bilaterally.  Neurologic: A&O X3, CN II - XII are grossly intact. Motor strength is 5/5 in the all 4 extremities.  Psych: Responds to questions appropriately with normal affect.    Labs: BMET:  Basename 04/24/11 0151 04/23/11 1519  NA 140 142  K 4.1 3.6  CL 106 106  CO2 28 25  GLUCOSE 127* 88  BUN 19 18  CREATININE 0.80 0.78  CALCIUM 9.0 9.4    MG -- --  PHOS -- --    Liver function tests:  Basename 04/23/11 1519  AST 16  ALT 10  ALKPHOS 66  BILITOT 0.4  PROT 6.6  ALBUMIN 4.0   No results found for this basename: LIPASE:2,AMYLASE:2 in the last 72 hours  CBC:  Basename 04/24/11 0151 04/23/11 1519  WBC 6.9 7.4  NEUTROABS -- 5.2  HGB 12.4 12.0  HCT 36.6 35.4*  MCV 91.3 91.5  PLT 167 212    Cardiac Enzymes:  Basename 04/24/11 0150 04/23/11 2025  CKTOTAL 50 57  CKMB 1.6 1.8  TROPONINI <0.30 <0.30    Coagulation Studies:  Basename 04/23/11 1519  LABPROT 14.0  INR 1.06    Other: No components found with this basename: POCBNP:3  Basename 04/23/11 1519  DDIMER 0.28   No results found for this basename: HGBA1C in the last 72 hours  Basename 04/24/11 0151  CHOL 189  HDL 57  LDLCALC 115*  TRIG 84  CHOLHDL 3.3   No results found for this basename: TSH,T4TOTAL,FREET3,T3FREE,THYROIDAB in the last 72 hours No results found for this basename: VITAMINB12,FOLATE,FERRITIN,TIBC,IRON,RETICCTPCT in the last 72 hours   Other results:   Tele:  NSR  Medications:    Scheduled Medications:    . amphetamine-dextroamphetamine  10 mg Oral Daily  . cholecalciferol  5,000 Units Oral Daily  . enoxaparin  40  mg Subcutaneous Q24H  . nitroGLYCERIN  1 inch Topical Once  . sodium chloride  3 mL Intravenous Q12H  . zolpidem  2.5 mg Oral QHS    Assessment/ Plan:    1. CP :  Cardiac enzymes are negative.  Pt is feeling better.   Pressure is still a 2 / 10 . She had some nausea this am and the last set of cardiac enzymes were not drawn.  Will check final set of CE.  Ambulate.  Home today after lunch if she is stable.  Given the fact that she has had constant atypcial CP and has negative enzymes , I suspect that the pressure is not cardiac  Disposition: home this afternoon after last enzymes are negative and if she ambulates without problems.  Length of Stay: 1  Vesta Mixer, Montez Hageman., MD, [facility] 04/24/2011, 9:56  AM

## 2011-05-01 ENCOUNTER — Encounter (HOSPITAL_COMMUNITY): Payer: BC Managed Care – PPO

## 2011-05-07 ENCOUNTER — Ambulatory Visit (HOSPITAL_COMMUNITY): Payer: BC Managed Care – PPO | Attending: Cardiovascular Disease | Admitting: Radiology

## 2011-05-07 VITALS — BP 129/85 | Ht 62.0 in | Wt 128.0 lb

## 2011-05-07 DIAGNOSIS — Z8249 Family history of ischemic heart disease and other diseases of the circulatory system: Secondary | ICD-10-CM | POA: Insufficient documentation

## 2011-05-07 DIAGNOSIS — R079 Chest pain, unspecified: Secondary | ICD-10-CM

## 2011-05-07 DIAGNOSIS — R0789 Other chest pain: Secondary | ICD-10-CM | POA: Insufficient documentation

## 2011-05-07 DIAGNOSIS — R002 Palpitations: Secondary | ICD-10-CM | POA: Insufficient documentation

## 2011-05-07 DIAGNOSIS — M542 Cervicalgia: Secondary | ICD-10-CM | POA: Insufficient documentation

## 2011-05-07 DIAGNOSIS — M25519 Pain in unspecified shoulder: Secondary | ICD-10-CM | POA: Insufficient documentation

## 2011-05-07 MED ORDER — TECHNETIUM TC 99M TETROFOSMIN IV KIT
30.0000 | PACK | Freq: Once | INTRAVENOUS | Status: AC | PRN
  Administered 2011-05-07: 30 via INTRAVENOUS

## 2011-05-07 MED ORDER — TECHNETIUM TC 99M TETROFOSMIN IV KIT
10.0000 | PACK | Freq: Once | INTRAVENOUS | Status: AC | PRN
  Administered 2011-05-07: 10 via INTRAVENOUS

## 2011-05-07 NOTE — Progress Notes (Signed)
[facility] SITE 3 NUCLEAR MED [address] [place] [address] [phone]  Cardiology Nuclear Med Study  [name] is a 56 y.o. female     MRN : [id number]     DOB: 1955-11-25  Procedure Date: 05/07/2011  Nuclear Med Background Indication for Stress Test:  Evaluation for Ischemia History: 2007 MPS:(-) ischemia Cardiac Risk Factors: Family History - CAD  Symptoms:  Chest Pain and Chest Pressure: fullness with radiation to (L) shoulder and neck, Palpitations   Nuclear Pre-Procedure Caffeine/Decaff Intake:  None NPO After: 7:00pm   Lungs:  clear O2 Sat: 98% on room air. IV 0.9% NS with Angio Cath:  22g  IV Site: R Antecubital  IV Started by:  Stanton Kidney, EMT-P  Chest Size (in):  36 Cup Size: C  Height: 5\' 2"  (1.575 m)  Weight:  128 lb (58.06 kg)  BMI:  Body mass index is 23.41 kg/(m^2). Tech Comments:  NA    Nuclear Med Study 1 or 2 day study: 1 day  Stress Test Type:  Stress  Reading MD: Charlton Haws, MD  Order Authorizing Provider:  J.Katz  Resting Radionuclide: Technetium 91m Tetrofosmin  Resting Radionuclide Dose: 11.0 mCi   Stress Radionuclide:  Technetium 5m Tetrofosmin  Stress Radionuclide Dose: 33.0 mCi           Stress Protocol Rest HR: 67 Stress HR: 148  Rest BP: 129/85 Stress BP: 153/67  Exercise Time (min): 7:00 METS: 8.50   Predicted Max HR: 165 bpm % Max HR: 89.7 bpm Rate Pressure Product: 72536   Dose of Adenosine (mg):  n/a Dose of Lexiscan: n/a mg  Dose of Atropine (mg): n/a Dose of Dobutamine: n/a mcg/kg/min (at max HR)  Stress Test Technologist: Milana Na, EMT-P  Nuclear Technologist:  Domenic Polite, CNMT     Rest Procedure:  Myocardial perfusion imaging was performed at rest 45 minutes following the intravenous administration of Technetium 64m Tetrofosmin. Rest ECG: NSR - Normal EKG  Stress Procedure:  The patient performed treadmill exercise using a Bruce  Protocol for 7:00 minutes. The patient stopped due  to fatigue and denied any chest pain.  There were non specific ST-T wave changes.  Technetium 57m Tetrofosmin was injected at peak exercise and myocardial perfusion imaging was performed after a brief delay. Stress ECG: No significant change from baseline ECG  QPS Raw Data Images:  Normal; no motion artifact; normal heart/lung ratio. Stress Images:  Normal homogeneous uptake in all areas of the myocardium. Rest Images:  Normal homogeneous uptake in all areas of the myocardium. Subtraction (SDS):  Normal Transient Ischemic Dilatation (Normal <1.22):  0.93 Lung/Heart Ratio (Normal <0.45):  0.28  Quantitative Gated Spect Images QGS EDV:  94 ml QGS ESV:  37 ml  Impression Exercise Capacity:  Good exercise capacity. BP Response:  Normal blood pressure response. Clinical Symptoms:  No significant symptoms noted. ECG Impression:  No significant ST segment change suggestive of ischemia. Comparison with Prior Nuclear Study: No images to compare  Overall Impression:  Normal stress nuclear study.  LV Ejection Fraction: 61%.  LV Wall Motion:  NL LV Function; NL Wall Motion    Charlton Haws

## 2011-05-08 ENCOUNTER — Encounter: Payer: BC Managed Care – PPO | Admitting: Physician Assistant

## 2011-05-08 NOTE — Progress Notes (Signed)
Addended by: Domenic Polite on: 05/08/2011 08:58 AM   Modules accepted: Orders

## 2011-05-09 ENCOUNTER — Ambulatory Visit (INDEPENDENT_AMBULATORY_CARE_PROVIDER_SITE_OTHER): Payer: BC Managed Care – PPO | Admitting: Physician Assistant

## 2011-05-09 ENCOUNTER — Encounter: Payer: Self-pay | Admitting: Physician Assistant

## 2011-05-09 VITALS — BP 120/82 | HR 71 | Ht 62.0 in | Wt 128.0 lb

## 2011-05-09 DIAGNOSIS — R079 Chest pain, unspecified: Secondary | ICD-10-CM

## 2011-05-09 MED ORDER — PANTOPRAZOLE SODIUM 40 MG PO TBEC: 40.0000 mg | DELAYED_RELEASE_TABLET | Freq: Every day | ORAL | Status: DC

## 2011-05-09 MED ORDER — NITROGLYCERIN 0.4 MG SL SUBL: 0.4000 mg | SUBLINGUAL_TABLET | SUBLINGUAL | Status: DC | PRN

## 2011-05-09 NOTE — Patient Instructions (Signed)
Your physician recommends that you schedule a follow-up appointment in: 1 month with Dr Myrtis Ser Your physician has recommended you make the following change in your medication: START Protonix 40 mg daily and KEEP Nitro on hand and use as directed

## 2011-05-09 NOTE — Progress Notes (Signed)
[address] [place], [place]  [address] Phone: [phone] Fax:  [phone]  Date:  05/09/2011   Name:  [name]       DOB:  [birth year] MRN:  [id number]  PCP:  Allean Found, MD, MD  Primary Cardiologist:  Dr. Zackery Barefoot  Primary Electrophysiologist:  None    History of Present Illness: [name] is a 56 y.o. female who returns for post hospital follow up.  She was admitted for chest pain 4/10-4/11.  MI ruled out.  CXR was ok.  Symptoms were felt to be atypical and she was d/c to home with plans for OP myoview.  ETT-Myoview 05/07/11:  Exercised for 7:00, no CP, normal images, normal ECGs, EF 61%.  She notes chest pressure off and on.  It is much better than before the hospital.  Did have some radiation to her left neck and arm.  Denies assoc dyspnea.  NTG helped when she was transported in ambulance from PCPs office.  No belching, dysphagia, vomiting or diarrhea.  She has had symptoms off and on in the past that she thought was related to gallbladder disease, but with neg workup.    Past Medical History  Diagnosis Date  . Migraine headache   . Anemia   . ADHD (attention deficit hyperactivity disorder)   . Fibromyalgia   . Vitamin D insufficiency   . PONV (postoperative nausea and vomiting)     Current Outpatient Prescriptions  Medication Sig Dispense Refill  . amphetamine-dextroamphetamine (ADDERALL) 10 MG tablet Take 10 mg by mouth daily.      Marland Kitchen VITAMIN D, CHOLECALCIFEROL, PO Take 5,000 Units by mouth daily.      Marland Kitchen zolpidem (AMBIEN) 5 MG tablet Take 2.5 mg by mouth at bedtime.      . nitroGLYCERIN (NITROSTAT) 0.4 MG SL tablet Place 1 tablet (0.4 mg total) under the tongue every 5 (five) minutes as needed for chest pain.  25 tablet  6  . pantoprazole (PROTONIX) 40 MG tablet Take 1 tablet (40 mg total) by mouth daily.  90 tablet  2    Allergies: Allergies  Allergen Reactions  . Codeine Other (See Comments)    hallucinations     History  Substance Use Topics  . Smoking status: Never Smoker   . Smokeless tobacco: Never Used  . Alcohol Use: No     Family History  Problem Relation Age of Onset  . Heart attack Father 46  . Heart attack Paternal Grandfather 64  . Arrhythmia Mother     Atrial fibrillation     ROS:  Please see the history of present illness.  Notes fatigue.  All other systems reviewed and negative.   PHYSICAL EXAM: VS:  BP 120/82  Pulse 71  Ht 5\' 2"  (1.575 m)  Wt 128 lb (58.06 kg)  BMI 23.41 kg/m2 Well nourished, well developed, in no acute distress HEENT: normal Neck: no JVD Cardiac:  normal S1, S2; RRR; no murmur Lungs:  clear to auscultation bilaterally, no wheezing, rhonchi or rales Abd: soft, nontender  Ext: no edema Skin: warm and dry Neuro:  CNs 2-12 intact, no focal abnormalities noted  EKG:  NSR, HR 71, normal axis, no acute changes  ASSESSMENT AND PLAN:  1. Chest pain, unspecified  Normal cardiac workup.  Does have significant FHx.  Also, has some typical features to pain.  I suggest she take Protonix 40 mg QD.  Will also give NTG to use prn.  Follow  up with Dr. Zackery Barefoot in 4-6 weeks.  If symptoms resolved, consider pursuing GI evaluation.  She knows to return sooner if symptoms worsen or go to the ED.        Luna Glasgow, PA-C  10:48 AM 05/09/2011

## 2011-06-17 ENCOUNTER — Encounter: Payer: Self-pay | Admitting: Cardiology

## 2011-06-17 DIAGNOSIS — G43909 Migraine, unspecified, not intractable, without status migrainosus: Secondary | ICD-10-CM | POA: Insufficient documentation

## 2011-06-17 DIAGNOSIS — F909 Attention-deficit hyperactivity disorder, unspecified type: Secondary | ICD-10-CM | POA: Insufficient documentation

## 2011-06-17 DIAGNOSIS — Z9889 Other specified postprocedural states: Secondary | ICD-10-CM

## 2011-06-17 DIAGNOSIS — E559 Vitamin D deficiency, unspecified: Secondary | ICD-10-CM | POA: Insufficient documentation

## 2011-06-17 DIAGNOSIS — D649 Anemia, unspecified: Secondary | ICD-10-CM | POA: Insufficient documentation

## 2011-06-17 DIAGNOSIS — R079 Chest pain, unspecified: Secondary | ICD-10-CM | POA: Insufficient documentation

## 2011-06-17 DIAGNOSIS — M797 Fibromyalgia: Secondary | ICD-10-CM | POA: Insufficient documentation

## 2011-06-17 DIAGNOSIS — R112 Nausea with vomiting, unspecified: Secondary | ICD-10-CM | POA: Insufficient documentation

## 2011-06-18 ENCOUNTER — Ambulatory Visit: Payer: BC Managed Care – PPO | Admitting: Cardiology

## 2011-06-24 ENCOUNTER — Ambulatory Visit: Payer: BC Managed Care – PPO | Admitting: Cardiology

## 2012-05-05 ENCOUNTER — Other Ambulatory Visit: Payer: Self-pay

## 2012-05-05 DIAGNOSIS — Z1231 Encounter for screening mammogram for malignant neoplasm of breast: Secondary | ICD-10-CM

## 2012-05-25 ENCOUNTER — Ambulatory Visit
Admission: RE | Admit: 2012-05-25 | Discharge: 2012-05-25 | Disposition: A | Discharge status: Home / Self Care | Payer: BC Managed Care – PPO | Source: Ambulatory Visit

## 2012-05-25 DIAGNOSIS — Z1231 Encounter for screening mammogram for malignant neoplasm of breast: Secondary | ICD-10-CM

## 2013-08-02 ENCOUNTER — Other Ambulatory Visit: Payer: Self-pay

## 2013-08-02 DIAGNOSIS — Z1231 Encounter for screening mammogram for malignant neoplasm of breast: Secondary | ICD-10-CM

## 2013-08-10 ENCOUNTER — Encounter (INDEPENDENT_AMBULATORY_CARE_PROVIDER_SITE_OTHER): Payer: Self-pay

## 2013-08-10 ENCOUNTER — Ambulatory Visit
Admission: RE | Admit: 2013-08-10 | Discharge: 2013-08-10 | Disposition: A | Discharge status: Home / Self Care | Payer: BC Managed Care – PPO | Source: Ambulatory Visit

## 2013-08-10 DIAGNOSIS — Z1231 Encounter for screening mammogram for malignant neoplasm of breast: Secondary | ICD-10-CM

## 2013-09-15 ENCOUNTER — Ambulatory Visit (INDEPENDENT_AMBULATORY_CARE_PROVIDER_SITE_OTHER): Payer: BC Managed Care – PPO | Admitting: Internal Medicine

## 2013-09-15 VITALS — BP 122/78 | HR 73 | Temp 98.1°F | Resp 12 | Ht 62.0 in | Wt 128.0 lb

## 2013-09-15 DIAGNOSIS — E559 Vitamin D deficiency, unspecified: Secondary | ICD-10-CM

## 2013-09-15 DIAGNOSIS — M8580 Other specified disorders of bone density and structure, unspecified site: Secondary | ICD-10-CM | POA: Insufficient documentation

## 2013-09-15 DIAGNOSIS — M899 Disorder of bone, unspecified: Secondary | ICD-10-CM

## 2013-09-15 DIAGNOSIS — R5383 Other fatigue: Secondary | ICD-10-CM

## 2013-09-15 DIAGNOSIS — R5381 Other malaise: Secondary | ICD-10-CM

## 2013-09-15 DIAGNOSIS — M949 Disorder of cartilage, unspecified: Secondary | ICD-10-CM

## 2013-09-15 DIAGNOSIS — R300 Dysuria: Secondary | ICD-10-CM

## 2013-09-15 MED ORDER — VITAMIN D (ERGOCALCIFEROL) 1.25 MG (50000 UNIT) PO CAPS: 50000.0000 [IU] | ORAL_CAPSULE | ORAL | Status: DC

## 2013-09-15 NOTE — Progress Notes (Signed)
Patient ID: [name], female   DOB: 06-Dec-1955, 58 y.o.   MRN: [id number]   HPI  [name] is a 58 y.o.-year-old female, referred by her PCP, Dr. Katrinka Blazing, for evaluation for osteopenia and fatigue. She is here with her husband who offers part of the history.  Fatigue: She c/o: - + fatigue, mostly ~2 pm, long-standing Also: - + feeling cold - + easy bruising - + slow healing - + some weight loss, not per our records - + hair loss  Reviewed prior w/u and history: 08/02/2013: CBC normal 08/02/2013: TSH 2.16 04/16/2010: Celiac panel normal  No prior B12 check. She had low vitamin D for >10 years. Last vit D level was 28.6 in 08/02/2013. She is on vit D 3000 IU daily. Was on Ergocalciferol weekly in the past. She had a normal am cortisol (salivary). She wonders about cortisol deficiency. She was dx with fibromyalgia in 2008. She has ADHD >> on Adderall. Denies depression Surgical menopause in 1997 (TAH + BSO). She is on Testosterone cypionate + Estradiol cypionate 50 mg-2 mg/mL monthly, last inj. 07/27/2013.  Osteopenia: Pt was dx with osteopenia in 2008. At last DEXA check, she was told her T scores have drastically decreased.   I reviewed pt's DEXA scan reports and images of the last DEXA scan with the pt: Date L1-L4 T score L FN T score FRAX  08/15/2013 -2.2 -2.3  MOF: 17.5%, HF: 1.6%  08/17/2006 -1.3 -1.9   No fractures. No dizziness/vertigo/orthostasis.  She has not been on Tx for her osteopenia in the past.  No h/o hyper/hypocalcemia. No h/o hyperparathyroidism. No h/o kidney stones. Lab Results  Component Value Date   CALCIUM 9.0 04/24/2011   CALCIUM 9.4 04/23/2011   No h/o thyrotoxicosis. Reviewed TSH recent levels:  08/02/2013: TSH 2.16  She has had a h/o vitamin D deficiency or at least 10 years Last vit D level was 28.6 in 08/02/2013.   No h/o CKD. Last BUN/Cr: 15/0.83, CMP normal 08/02/2013. Per Epic records: Lab Results  Component Value Date   BUN 19  04/24/2011   CREATININE 0.80 04/24/2011   Pt is not on calcium but takes vitamin D (3000 IU a day) - has been on 50,000 weekly >3 years ago. She also eats dairy and green, leafy, vegetables, but not too much.  No weight bearing exercises, only walks.  She does not take high vitamin A doses.  Pt does have a FH of osteoporosis in mother. Mother had 3 fractures.   I reviewed her chart and she also has a history of ADHD >> on Adderall. This helps.  ROS: Constitutional: see HPI Eyes: no blurry vision, no xerophthalmia ENT: no sore throat, no nodules palpated in throat, no dysphagia/odynophagia, + hoarseness, + tinnitus Cardiovascular: no CP/SOB/palpitations/leg swelling Respiratory: no cough/SOB Gastrointestinal: no N/V/D/+ C Musculoskeletal: + both muscle/joint aches Skin: no rashes, + easy bruising, + hair loss Neurological: no tremors/numbness/tingling/dizziness Psychiatric: no depression/anxiety + Low libido  Past Medical History  Diagnosis Date  . Migraine headache   . Anemia   . ADHD (attention deficit hyperactivity disorder)   . Fibromyalgia   . Vitamin D insufficiency   . PONV (postoperative nausea and vomiting)   . Chest pain     Hospital, April, 2013, no MI,  //   nuclear, outpatient, May 07, 2011, ejection fraction 61%, normal  . Anxiety    Past Surgical History  Procedure Laterality Date  . Tonsillectomy and adenoidectomy  1976  . Reduction  mammaplasty  1990  . Appendectomy  1990's  . Abdominal hysterectomy  2008   History   Social History  . Marital Status: Married    Spouse Name: N/A    Number of Children: 2   Occupational History  . Teacher    Social History Main Topics  . Smoking status: Never Smoker   . Smokeless tobacco: Never Used  . Alcohol Use: No  . Drug Use: No  . Sexual Activity: Yes   Social History Narrative   Lives in [place], [place] with husband.    Current Outpatient Prescriptions on File Prior to Visit  Medication Sig Dispense  Refill  . amphetamine-dextroamphetamine (ADDERALL) 10 MG tablet Take 10 mg by mouth daily.      Marland Kitchen VITAMIN D, CHOLECALCIFEROL, PO Take 3,000 Units by mouth daily.       Marland Kitchen zolpidem (AMBIEN) 5 MG tablet Take 2.5 mg by mouth at bedtime.      . nitroGLYCERIN (NITROSTAT) 0.4 MG SL tablet Place 1 tablet (0.4 mg total) under the tongue every 5 (five) minutes as needed for chest pain.  25 tablet  6  . pantoprazole (PROTONIX) 40 MG tablet Take 1 tablet (40 mg total) by mouth daily.  90 tablet  2   No current facility-administered medications on file prior to visit.   Allergies  Allergen Reactions  . Codeine Other (See Comments)    hallucinations   Family History  Problem Relation Age of Onset  . Heart attack Father 102  . Heart attack Paternal Grandfather 55  . Arrhythmia Mother     Atrial fibrillation   PE: BP 122/78  Pulse 73  Temp(Src) 98.1 F (36.7 C) (Oral)  Resp 12  Ht  (1.575 m)  Wt 128 lb (58.06 kg)  BMI 23.41 kg/m2  SpO2 99% Wt Readings from Last 3 Encounters:  09/15/13 128 lb (58.06 kg)  05/09/11 128 lb (58.06 kg)  05/07/11 128 lb (58.06 kg)   Constitutional: normal weight, in NAD. No kyphosis. Tanned. Eyes: PERRLA, EOMI, no exophthalmos ENT: moist mucous membranes, no thyromegaly, no cervical lymphadenopathy Cardiovascular: RRR, No MRG Respiratory: CTA B Gastrointestinal: abdomen soft, NT, ND, BS+ Musculoskeletal: no deformities, strength intact in all 4 Skin: moist, warm, no rashes Neurological: no tremor with outstretched hands, DTR normal in all 4  Assessment: 1. Fatigue  2. Osteopenia  3. Vitamin D def  4. Dysuria  Plan: 1. Fatigue - discuss possible endocrine causes of fatigue - recommended a diet rich in proteins - no apparent celiac ds - no anemia - no severe vit D deficiency  - has fibromyalgia - no hypothyroidism  Will check the following tests (including a cosyntropin stimulation test for adrenal insufficiency): Orders Placed This  Encounter  Procedures  . B12  . Methylmalonic Acid  . TSH  . T4, free  . T3, free  . Thyroid Peroxidase Antibody  . Cortisol  . ACTH  . Cortisol  . Cortisol   2. Osteopenia - likely postmenopausal  - Discussed about increased risk of fracture, depending on the T score, greatly increased when the T score is lower than -2.5, but it is actually a continuum and -2.5 should not be regarded as an absolute threshold.  - We reviewed her DEXA scans together, and I explained that based on the T scores, she has an increased risk for fractures.  - We discussed about the different medication classes, benefits and side effects (including atypical fractures and ONJ):   oral bisphosphonates  IV bisphosphonate (zoledronic acid = Reclast) or   sq denosumab (Prolia).   Teriparatide (Forteo) - we decided to optimize her exercise, calcium and vitamin D status in the next 2 years and then check a new DEXA and decide if she needs tx then - discussed fall precautions  - given handout from [facility] Re: weight bearing exercises  - advised to do this every day or at least 5/7 days- we reviewed her dietary and supplemental calcium and vitamin D intake. I advised her to get at least 1000 mg of calcium daily. I advised her to continue add a calcium supplement if she gets less daily from diet - given her specific instructions about food sources for these - see pt instructions  - we discussed about maintaining a good amount of protein in her diet. The recommended daily protein intake is ~0.8 g per kilogram per day. I advised her to try to aim for this amount, since a diet low in proteins can exacerbate osteoporosis. Also, avoid smoking or >2 drinks of alcohol a day, which she already does. - will start Vitamin D: Ergocalciferol 50,000 IU monthly >> recheck a vitamin D level in 3 mo. We need to get her level above 30. - will check a new DEXA scan in 2 years, and if it decreased further, I  would strongly encourage treatment, likely in the form of po bisphosphonates (or possibly zoledronic acid or denosumab) - will see pt back in a year  3. Vit D def See above  4. Dysuria - will check U/A per pt's request Component     Latest Ref Rng 09/16/2013  Color, Urine     Yellow;Lt. Yellow YELLOW  APPearance     Clear SL CLOUDY (A)  Specific Gravity, Urine     1.000-1.030 1.025  pH     5.0 - 8.0 5.5  Total Protein, Urine-UPE24     Negative NEGATIVE  Urine Glucose     Negative NEGATIVE  Ketones, ur     Negative NEGATIVE  Bilirubin Urine     Negative NEGATIVE  Hgb urine dipstick     Negative NEGATIVE  Urobilinogen, UA     0.0 - 1.0 1.0  Leukocytes, UA     Negative NEGATIVE  Nitrite     Negative POSITIVE (A)  WBC, UA     0-2/hpf 3-6/hpf (A)  RBC / HPF     0-2/hpf 0-2/hpf  Squamous Epithelial / LPF     Rare(0-4/hpf) Many(>10/hpf) (A)  Renal Epithel, UA     None Rare(0-4/hpf) (A)  Bacteria, UA     None Many(>50/hpf) (A)  Called in Ciprofloxacin 500 mg bid x 3 days.   - time spent with the patient: 1 hour, of which >50% was spent in obtaining information about her symptoms, reviewing her previous labs, evaluations, and treatments, and mostly counseling her about her conditions (please see the discussed topics above), and developing a plan to further investigate them. She and her husband had a number of questions which I addressed.  Component     Latest Ref Rng 09/16/2013 09/16/2013 09/16/2013 09/16/2013         8:14 AM  8:14 AM  8:58 AM  9:28 AM  Vitamin B-12     211 - 911 pg/mL  319    Methylmalonic Acid, Quant     87 - 318 nmol/L 171     TSH     0.35 - 4.50 uIU/mL  2.15    Free  T4     0.60 - 1.60 ng/dL  1.61    T3, Free     2.3 - 4.2 pg/mL  2.7    Thyroid Peroxidase Antibody     <9 IU/mL 1     Cortisol, Plasma       12.9 26.2 31.2  C206 ACTH     6 - 50 pg/mL 12     VITD     30.00 - 100.00 ng/mL  45.86     ACTH and Cosyntropin Stimulation test normal >> no  signs of adrenal insufficiency Vit D normal. Vit B12 low normal, with normal MMA >> will suggest po supplementation with 1000 mcg vit B12 daily. No thyroid abnormality.

## 2013-09-15 NOTE — Patient Instructions (Signed)
Please come at the lab at 8 am tomorrow (no need to be fasting) and plan to be here for 1h.  Please make sure you get at least 1000 mg calcium a day from food and if you do not, then you need a calcium supplement (this is taken with food).  Start Ergocalciferol 50,000 units every month. You can stop the daily supplement if we start this.  Please come back in 3 months for a recheck vitamin D - this can also be done by your PCP and sent to me.  Exercise for Strong Bones (from [place]) There are two types of exercises that are important for building and maintaining bone density:  weight-bearing and muscle-strengthening exercises. Weight-bearing Exercises These exercises include activities that make you move against gravity while staying upright. Weight-bearing exercises can be high-impact or low-impact. High-impact weight-bearing exercises help build bones and keep them strong. If you have broken a bone due to osteoporosis or are at risk of breaking a bone, you may need to avoid high-impact exercises. If you're not sure, you should check with your healthcare provider. Examples of high-impact weight-bearing exercises are:   Dancing   Doing high-impact aerobics   Hiking   Jogging/running   Jumping Rope   Stair climbing   Tennis Low-impact weight-bearing exercises can also help keep bones strong and are a safe alternative if you cannot do high-impact exercises. Examples of low-impact weight-bearing exercises are:   Using elliptical training machines   Doing low-impact aerobics   Using stair-step machines   Fast walking on a treadmill or outside Muscle-Strengthening Exercises These exercises include activities where you move your body, a weight or some other resistance against gravity. They are also known as resistance exercises and include:   Lifting weights   Using elastic exercise bands   Using weight machines   Lifting your own body weight   Functional movements,  such as standing and rising up on your toes Yoga and Pilates can also improve strength, balance and flexibility. However, certain positions may not be safe for people with osteoporosis or those at increased risk of broken bones. For example, exercises that have you bend forward may increase the chance of breaking a bone in the spine. A physical therapist should be able to help you learn which exercises are safe and appropriate for you. Non-Impact Exercises Non-impact exercises can help you to improve balance, posture and how well you move in everyday activities. These exercises can also help to increase muscle strength and decrease the risk of falls and broken bones. Some of these exercises include:   Balance exercises that strengthen your legs and test your balance, such as Tai Chi, can decrease your risk of falls.   Posture exercises that improve your posture and reduce rounded or "sloping" shoulders can help you decrease the chance of breaking a bone, especially in the spine.   Functional exercises that improve how well you move can help you with everyday activities and decrease your chance of falling and breaking a bone. For example, if you have trouble getting up from a chair or climbing stairs, you should do these activities as exercises. A physical therapist can teach you balance, posture and functional exercises. Starting a New Exercise Program If you haven't exercised regularly for a while, check with your healthcare provider before beginning a new exercise program-particularly if you have health problems such as heart disease, diabetes or high blood pressure. If you're at high risk of breaking a bone, you  should work with a physical therapist to develop a safe exercise program. Once you have your healthcare provider's approval, start slowly. If you've already broken bones in the spine because of osteoporosis, be very careful to avoid activities that require reaching down, bending forward, rapid  twisting motions, heavy lifting and those that increase your chance of a fall. As you get started, your muscles may feel sore for a day or two after you exercise. If soreness lasts longer, you may be working too hard and need to ease up. Exercises should be done in a pain-free range of motion. How Much Exercise Do You Need? Weight-bearing exercises 30 minutes on most days of the week. Do a 30-minutesession or multiple sessions spread out throughout the day. The benefits to your bones are the same.   Muscle-strengthening exercises Two to three days per week. If you don't have much time for strengthening/resistance training, do small amounts at a time. You can do just one body part each day. For example do arms one day, legs the next and trunk the next. You can also spread these exercises out during your normal day.  Balance, posture and functional exercises Every day or as often as needed. You may want to focus on one area more than the others. If you have fallen or lose your balance, spend time doing balance exercises. If you are getting rounded shoulders, work more on posture exercises. If you have trouble climbing stairs or getting up from the couch, do more functional exercises. You can also perform these exercises at one time or spread them during your day. Work with a phyiscal therapist to learn the right exercises for you.    How Can I Prevent Falls? Men and women with osteoporosis need to take care not to fall down. Falls can break bones. Some reasons people fall are: Poor vision  Poor balance  Certain diseases that affect how you walk  Some types of medicine, such as sleeping pills.  Some tips to help prevent falls outdoors are: Use a cane or walker  Wear rubber-soled shoes so you don't slip  Walk on grass when sidewalks are slippery  In winter, put salt or kitty litter on icy sidewalks.  Some ways to help prevent falls indoors are: Keep rooms free of clutter, especially on floors  Use  plastic or carpet runners on slippery floors  Wear low-heeled shoes that provide good support  Do not walk in socks, stockings, or slippers  Be sure carpets and area rugs have skid-proof backs or are tacked to the floor  Be sure stairs are well lit and have rails on both sides  Put grab bars on bathroom walls near tub, shower, and toilet  Use a rubber bath mat in the shower or tub  Keep a flashlight next to your bed  Use a sturdy step stool with a handrail and wide steps  Add more lights in rooms (and night lights) Buy a cordless phone to keep with you so that you don't have to rush to the phone       when it rings and so that you can call for help if you fall.   (adapted from http://www.niams.NightlifePreviews.se)  Dietary sources of calcium and vitamin D:  Calcium content (mg) - http://www.niams.MoviePins.co.za  Fortified oatmeal, 1 packet 350  Sardines, canned in oil, with edible bones, 3 oz. 324  Cheddar cheese, 1 oz. shredded 306  Milk, nonfat, 1 cup 302  Milkshake, 1 cup 300  Yogurt, plain, low-fat,  1 cup 300  Soybeans, cooked, 1 cup 261  Tofu, firm, with calcium,  cup 204  Orange juice, fortified with calcium, 6 oz. 200-260 (varies)  Salmon, canned, with edible bones, 3 oz. 181  Pudding, instant, made with 2% milk,  cup 153  Baked beans, 1 cup [place], 1% milk fat, 1 cup 138  Spaghetti, lasagna, 1 cup 125  Frozen yogurt, vanilla, soft-serve,  cup 103  Ready-to-eat cereal, fortified with calcium, 1 cup 100-1,000 (varies)  Cheese pizza, 1 slice 017  Fortified waffles, 2 100  Turnip greens, boiled,  cup 99  Broccoli, raw, 1 cup 90  Ice cream, vanilla,  cup 85  Soy or rice milk, fortified with calcium, 1 cup 80-500 (varies)   Vitamin D content (International Units, IU) - https://www.ars.usda.gov Cod liver oil, 1 tablespoon 1,360  Swordfish, cooked, 3 oz 566  Salmon (sockeye), cooked, 3 oz 447   Tuna fish, canned in water, drained, 3 oz 154  Orange juice fortified with vitamin D, 1 cup (check product labels, as amount of added vitamin D varies) 137  Milk, nonfat, reduced fat, and whole, vitamin D-fortified, 1 cup 115-124  Yogurt, fortified with 20% of the daily value for vitamin D, 6 oz 80  Margarine, fortified, 1 tablespoon 60  Sardines, canned in oil, drained, 2 sardines 46  Liver, beef, cooked, 3 oz 42  Egg, 1 large (vitamin D is found in yolk) 41  Ready-to-eat cereal, fortified with 10% of the daily value for vitamin D, 0.75-1 cup  40  Cheese, Swiss, 1 oz 6

## 2013-09-16 ENCOUNTER — Other Ambulatory Visit: Payer: Self-pay | Admitting: *Deleted

## 2013-09-16 ENCOUNTER — Other Ambulatory Visit (INDEPENDENT_AMBULATORY_CARE_PROVIDER_SITE_OTHER): Payer: BC Managed Care – PPO

## 2013-09-16 DIAGNOSIS — R5383 Other fatigue: Principal | ICD-10-CM

## 2013-09-16 DIAGNOSIS — R5381 Other malaise: Secondary | ICD-10-CM

## 2013-09-16 DIAGNOSIS — M949 Disorder of cartilage, unspecified: Secondary | ICD-10-CM

## 2013-09-16 DIAGNOSIS — R309 Painful micturition, unspecified: Secondary | ICD-10-CM

## 2013-09-16 DIAGNOSIS — M899 Disorder of bone, unspecified: Secondary | ICD-10-CM

## 2013-09-16 DIAGNOSIS — M8580 Other specified disorders of bone density and structure, unspecified site: Secondary | ICD-10-CM

## 2013-09-16 LAB — URINALYSIS, ROUTINE W REFLEX MICROSCOPIC
Bilirubin Urine: NEGATIVE
HGB URINE DIPSTICK: NEGATIVE
KETONES UR: NEGATIVE
Leukocytes, UA: NEGATIVE
Nitrite: POSITIVE — AB
Specific Gravity, Urine: 1.025 (ref 1.000–1.030)
TOTAL PROTEIN, URINE-UPE24: NEGATIVE
URINE GLUCOSE: NEGATIVE
UROBILINOGEN UA: 1 (ref 0.0–1.0)
pH: 5.5 (ref 5.0–8.0)

## 2013-09-16 LAB — T3, FREE: T3 FREE: 2.7 pg/mL (ref 2.3–4.2)

## 2013-09-16 LAB — TSH: TSH: 2.15 u[IU]/mL (ref 0.35–4.50)

## 2013-09-16 LAB — VITAMIN D 25 HYDROXY (VIT D DEFICIENCY, FRACTURES): VITD: 45.86 ng/mL (ref 30.00–100.00)

## 2013-09-16 LAB — CORTISOL
CORTISOL PLASMA: 12.9 ug/dL
CORTISOL PLASMA: 26.2 ug/dL
Cortisol, Plasma: 31.2 ug/dL

## 2013-09-16 LAB — VITAMIN B12: Vitamin B-12: 319 pg/mL (ref 211–911)

## 2013-09-16 LAB — T4, FREE: Free T4: 0.98 ng/dL (ref 0.60–1.60)

## 2013-09-16 MED ORDER — COSYNTROPIN 0.25 MG IJ SOLR
0.2500 mg | Freq: Once | INTRAMUSCULAR | Status: AC
  Administered 2013-09-16: 0.25 mg via INTRAMUSCULAR

## 2013-09-16 MED ORDER — CIPROFLOXACIN HCL 500 MG PO TABS: 500.0000 mg | ORAL_TABLET | Freq: Two times a day (BID) | ORAL | Status: DC

## 2013-09-17 LAB — THYROID PEROXIDASE ANTIBODY: THYROID PEROXIDASE ANTIBODY: 1 [IU]/mL (ref <9)

## 2013-09-18 LAB — METHYLMALONIC ACID, SERUM: Methylmalonic Acid, Quant: 171 nmol/L (ref 87–318)

## 2013-09-20 ENCOUNTER — Encounter: Payer: Self-pay | Admitting: Internal Medicine

## 2013-09-20 LAB — ACTH: C206 ACTH: 12 pg/mL (ref 6–50)

## 2013-11-30 ENCOUNTER — Other Ambulatory Visit: Payer: Self-pay | Admitting: Obstetrics and Gynecology

## 2013-12-01 LAB — CYTOLOGY - PAP

## 2014-08-28 ENCOUNTER — Other Ambulatory Visit: Payer: Self-pay

## 2014-08-28 DIAGNOSIS — Z1231 Encounter for screening mammogram for malignant neoplasm of breast: Secondary | ICD-10-CM

## 2014-08-30 ENCOUNTER — Ambulatory Visit
Admission: RE | Admit: 2014-08-30 | Discharge: 2014-08-30 | Disposition: A | Discharge status: Home / Self Care | Payer: BLUE CROSS/BLUE SHIELD | Source: Ambulatory Visit

## 2014-08-30 DIAGNOSIS — Z1231 Encounter for screening mammogram for malignant neoplasm of breast: Secondary | ICD-10-CM

## 2014-12-04 ENCOUNTER — Other Ambulatory Visit: Payer: Self-pay | Admitting: Nurse Practitioner

## 2014-12-04 DIAGNOSIS — N6313 Unspecified lump in the right breast, lower outer quadrant: Secondary | ICD-10-CM

## 2014-12-04 DIAGNOSIS — N6321 Unspecified lump in the left breast, upper outer quadrant: Secondary | ICD-10-CM

## 2014-12-12 ENCOUNTER — Ambulatory Visit
Admission: RE | Admit: 2014-12-12 | Discharge: 2014-12-12 | Disposition: A | Discharge status: Home / Self Care | Payer: BLUE CROSS/BLUE SHIELD | Source: Ambulatory Visit | Attending: Nurse Practitioner | Admitting: Nurse Practitioner

## 2014-12-12 DIAGNOSIS — N6313 Unspecified lump in the right breast, lower outer quadrant: Secondary | ICD-10-CM

## 2014-12-12 DIAGNOSIS — N6321 Unspecified lump in the left breast, upper outer quadrant: Secondary | ICD-10-CM

## 2015-06-23 ENCOUNTER — Other Ambulatory Visit: Payer: Self-pay | Admitting: General Surgery

## 2015-06-23 DIAGNOSIS — R922 Inconclusive mammogram: Secondary | ICD-10-CM

## 2015-06-23 DIAGNOSIS — N6019 Diffuse cystic mastopathy of unspecified breast: Secondary | ICD-10-CM

## 2015-07-06 ENCOUNTER — Ambulatory Visit
Admission: RE | Admit: 2015-07-06 | Discharge: 2015-07-06 | Disposition: A | Discharge status: Home / Self Care | Payer: 59 | Source: Ambulatory Visit | Attending: General Surgery | Admitting: General Surgery

## 2015-07-06 DIAGNOSIS — N6019 Diffuse cystic mastopathy of unspecified breast: Secondary | ICD-10-CM

## 2015-07-06 DIAGNOSIS — R9230 Dense breasts, unspecified: Secondary | ICD-10-CM

## 2015-07-06 DIAGNOSIS — R922 Inconclusive mammogram: Secondary | ICD-10-CM

## 2017-03-04 ENCOUNTER — Other Ambulatory Visit: Payer: Self-pay | Admitting: Obstetrics and Gynecology

## 2017-03-04 DIAGNOSIS — Z139 Encounter for screening, unspecified: Secondary | ICD-10-CM

## 2017-03-25 ENCOUNTER — Ambulatory Visit
Admission: RE | Admit: 2017-03-25 | Discharge: 2017-03-25 | Disposition: A | Discharge status: Home / Self Care | Payer: 59 | Source: Ambulatory Visit | Attending: Obstetrics and Gynecology | Admitting: Obstetrics and Gynecology

## 2017-03-25 DIAGNOSIS — Z139 Encounter for screening, unspecified: Secondary | ICD-10-CM

## 2017-03-26 ENCOUNTER — Other Ambulatory Visit: Payer: Self-pay | Admitting: Obstetrics and Gynecology

## 2017-03-26 DIAGNOSIS — R928 Other abnormal and inconclusive findings on diagnostic imaging of breast: Secondary | ICD-10-CM

## 2017-03-31 ENCOUNTER — Other Ambulatory Visit: Payer: Self-pay | Admitting: Obstetrics and Gynecology

## 2017-03-31 ENCOUNTER — Ambulatory Visit
Admission: RE | Admit: 2017-03-31 | Discharge: 2017-03-31 | Disposition: A | Discharge status: Home / Self Care | Payer: 59 | Source: Ambulatory Visit | Attending: Obstetrics and Gynecology | Admitting: Obstetrics and Gynecology

## 2017-03-31 DIAGNOSIS — N6320 Unspecified lump in the left breast, unspecified quadrant: Secondary | ICD-10-CM

## 2017-03-31 DIAGNOSIS — R928 Other abnormal and inconclusive findings on diagnostic imaging of breast: Secondary | ICD-10-CM

## 2017-04-08 ENCOUNTER — Ambulatory Visit
Admission: RE | Admit: 2017-04-08 | Discharge: 2017-04-08 | Disposition: A | Discharge status: Home / Self Care | Payer: 59 | Source: Ambulatory Visit | Attending: Obstetrics and Gynecology | Admitting: Obstetrics and Gynecology

## 2017-04-08 ENCOUNTER — Other Ambulatory Visit: Payer: Self-pay | Admitting: Obstetrics and Gynecology

## 2017-04-08 DIAGNOSIS — N6320 Unspecified lump in the left breast, unspecified quadrant: Secondary | ICD-10-CM

## 2017-07-23 ENCOUNTER — Other Ambulatory Visit: Payer: Self-pay | Admitting: Obstetrics and Gynecology

## 2017-07-23 DIAGNOSIS — N6012 Diffuse cystic mastopathy of left breast: Secondary | ICD-10-CM

## 2017-08-11 ENCOUNTER — Ambulatory Visit: Payer: 59

## 2017-08-11 ENCOUNTER — Ambulatory Visit
Admission: RE | Admit: 2017-08-11 | Discharge: 2017-08-11 | Disposition: A | Discharge status: Home / Self Care | Payer: 59 | Source: Ambulatory Visit | Attending: Obstetrics and Gynecology | Admitting: Obstetrics and Gynecology

## 2017-08-11 DIAGNOSIS — N6012 Diffuse cystic mastopathy of left breast: Secondary | ICD-10-CM

## 2018-01-20 DIAGNOSIS — E559 Vitamin D deficiency, unspecified: Secondary | ICD-10-CM | POA: Diagnosis not present

## 2018-01-20 DIAGNOSIS — E782 Mixed hyperlipidemia: Secondary | ICD-10-CM | POA: Diagnosis not present

## 2018-01-20 DIAGNOSIS — N951 Menopausal and female climacteric states: Secondary | ICD-10-CM | POA: Diagnosis not present

## 2018-01-27 DIAGNOSIS — Z1339 Encounter for screening examination for other mental health and behavioral disorders: Secondary | ICD-10-CM | POA: Diagnosis not present

## 2018-01-27 DIAGNOSIS — E782 Mixed hyperlipidemia: Secondary | ICD-10-CM | POA: Diagnosis not present

## 2018-01-27 DIAGNOSIS — Z72820 Sleep deprivation: Secondary | ICD-10-CM | POA: Diagnosis not present

## 2018-01-27 DIAGNOSIS — R635 Abnormal weight gain: Secondary | ICD-10-CM | POA: Diagnosis not present

## 2018-01-27 DIAGNOSIS — Z1331 Encounter for screening for depression: Secondary | ICD-10-CM | POA: Diagnosis not present

## 2018-01-27 DIAGNOSIS — E559 Vitamin D deficiency, unspecified: Secondary | ICD-10-CM | POA: Diagnosis not present

## 2018-02-03 DIAGNOSIS — Z6825 Body mass index (BMI) 25.0-25.9, adult: Secondary | ICD-10-CM | POA: Diagnosis not present

## 2018-02-03 DIAGNOSIS — E782 Mixed hyperlipidemia: Secondary | ICD-10-CM | POA: Diagnosis not present

## 2018-02-10 DIAGNOSIS — E559 Vitamin D deficiency, unspecified: Secondary | ICD-10-CM | POA: Diagnosis not present

## 2018-02-10 DIAGNOSIS — Z6825 Body mass index (BMI) 25.0-25.9, adult: Secondary | ICD-10-CM | POA: Diagnosis not present

## 2018-02-17 DIAGNOSIS — Z6825 Body mass index (BMI) 25.0-25.9, adult: Secondary | ICD-10-CM | POA: Diagnosis not present

## 2018-02-17 DIAGNOSIS — E782 Mixed hyperlipidemia: Secondary | ICD-10-CM | POA: Diagnosis not present

## 2018-02-24 DIAGNOSIS — E559 Vitamin D deficiency, unspecified: Secondary | ICD-10-CM | POA: Diagnosis not present

## 2018-02-24 DIAGNOSIS — E782 Mixed hyperlipidemia: Secondary | ICD-10-CM | POA: Diagnosis not present

## 2018-02-24 DIAGNOSIS — Z6825 Body mass index (BMI) 25.0-25.9, adult: Secondary | ICD-10-CM | POA: Diagnosis not present

## 2018-04-28 DIAGNOSIS — Z6824 Body mass index (BMI) 24.0-24.9, adult: Secondary | ICD-10-CM | POA: Diagnosis not present

## 2018-04-28 DIAGNOSIS — Z01419 Encounter for gynecological examination (general) (routine) without abnormal findings: Secondary | ICD-10-CM | POA: Diagnosis not present

## 2018-05-13 DIAGNOSIS — F900 Attention-deficit hyperactivity disorder, predominantly inattentive type: Secondary | ICD-10-CM | POA: Diagnosis not present

## 2018-05-13 DIAGNOSIS — G43909 Migraine, unspecified, not intractable, without status migrainosus: Secondary | ICD-10-CM | POA: Diagnosis not present

## 2018-10-01 DIAGNOSIS — R6889 Other general symptoms and signs: Secondary | ICD-10-CM | POA: Diagnosis not present

## 2018-10-01 DIAGNOSIS — R030 Elevated blood-pressure reading, without diagnosis of hypertension: Secondary | ICD-10-CM | POA: Diagnosis not present

## 2018-10-01 DIAGNOSIS — R42 Dizziness and giddiness: Secondary | ICD-10-CM | POA: Diagnosis not present

## 2018-10-01 DIAGNOSIS — E559 Vitamin D deficiency, unspecified: Secondary | ICD-10-CM | POA: Diagnosis not present

## 2018-10-06 DIAGNOSIS — H43813 Vitreous degeneration, bilateral: Secondary | ICD-10-CM | POA: Diagnosis not present

## 2018-10-06 DIAGNOSIS — H25811 Combined forms of age-related cataract, right eye: Secondary | ICD-10-CM | POA: Diagnosis not present

## 2018-10-06 DIAGNOSIS — H04123 Dry eye syndrome of bilateral lacrimal glands: Secondary | ICD-10-CM | POA: Diagnosis not present

## 2018-10-06 DIAGNOSIS — H2512 Age-related nuclear cataract, left eye: Secondary | ICD-10-CM | POA: Diagnosis not present

## 2018-10-06 DIAGNOSIS — H532 Diplopia: Secondary | ICD-10-CM | POA: Diagnosis not present

## 2018-11-18 IMAGING — MG MM CLIP PLACEMENT
3 series · 3 of 3 positions shown · non-contrast
Comparison: Previous exam(s).

CLINICAL DATA: Post biopsy mammogram of the left breast for clip
placement.

EXAM:
DIAGNOSTIC LEFT MAMMOGRAM POST ULTRASOUND BIOPSY

[L MLO]
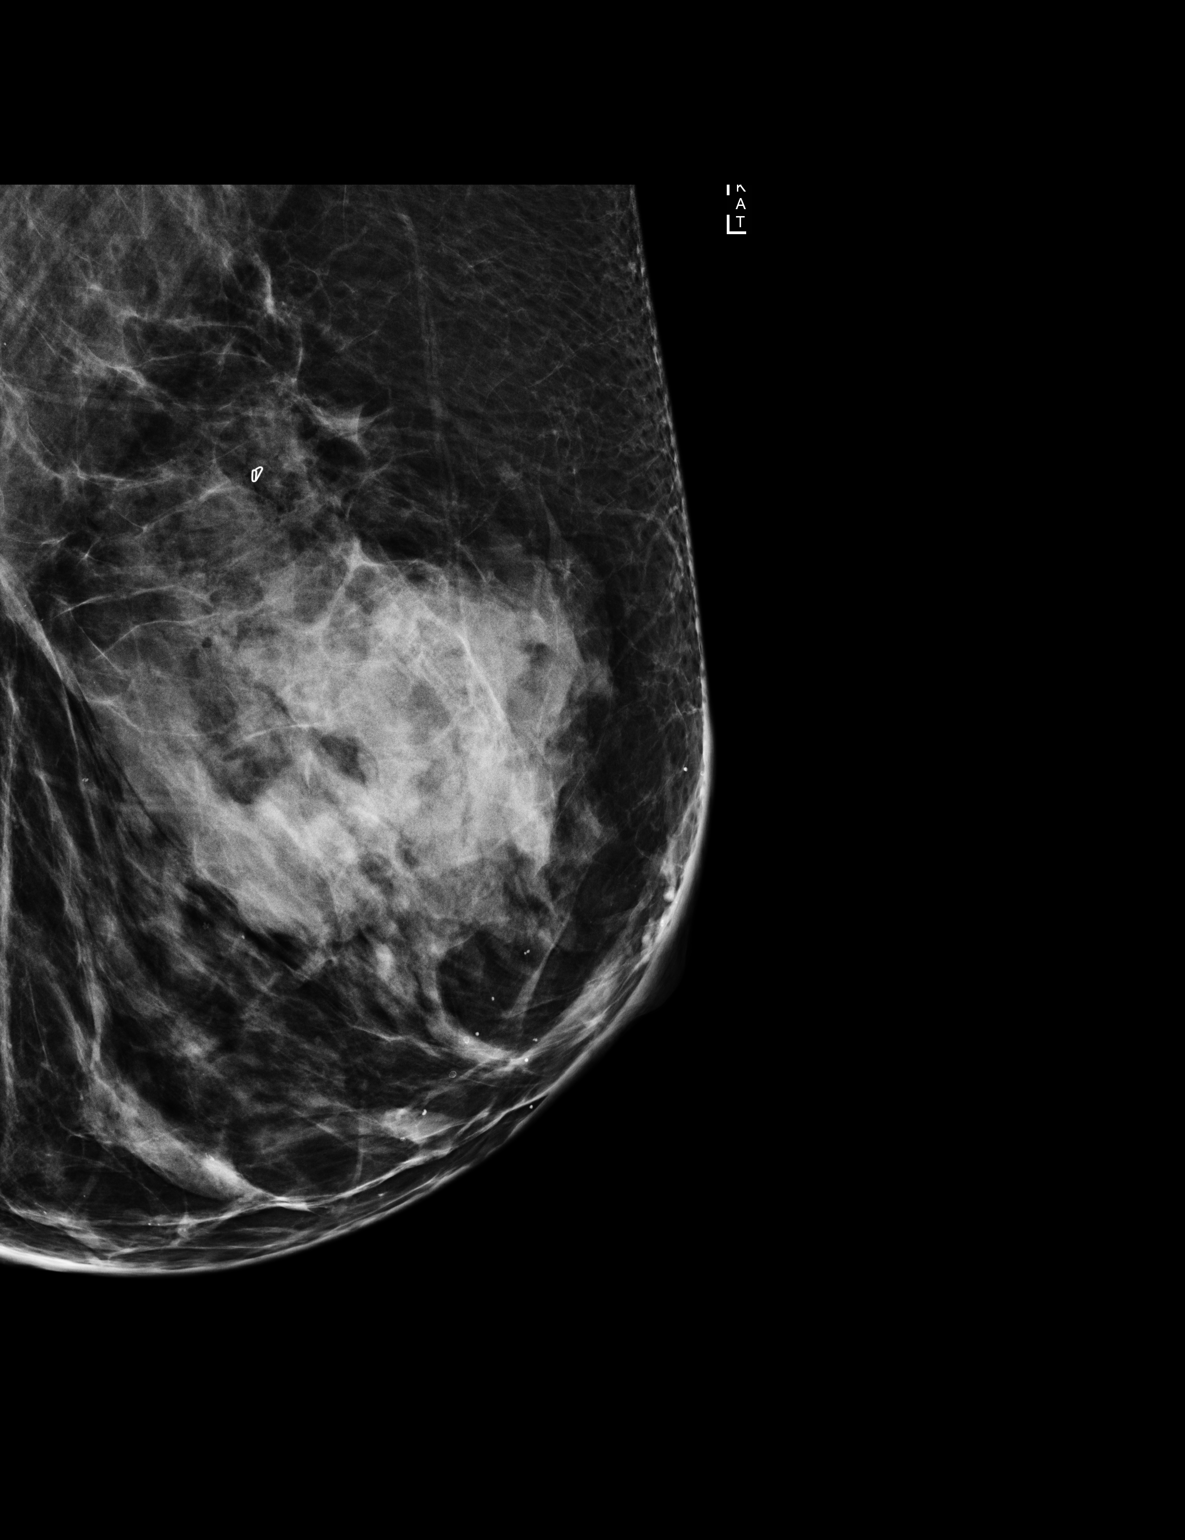

[L ML]
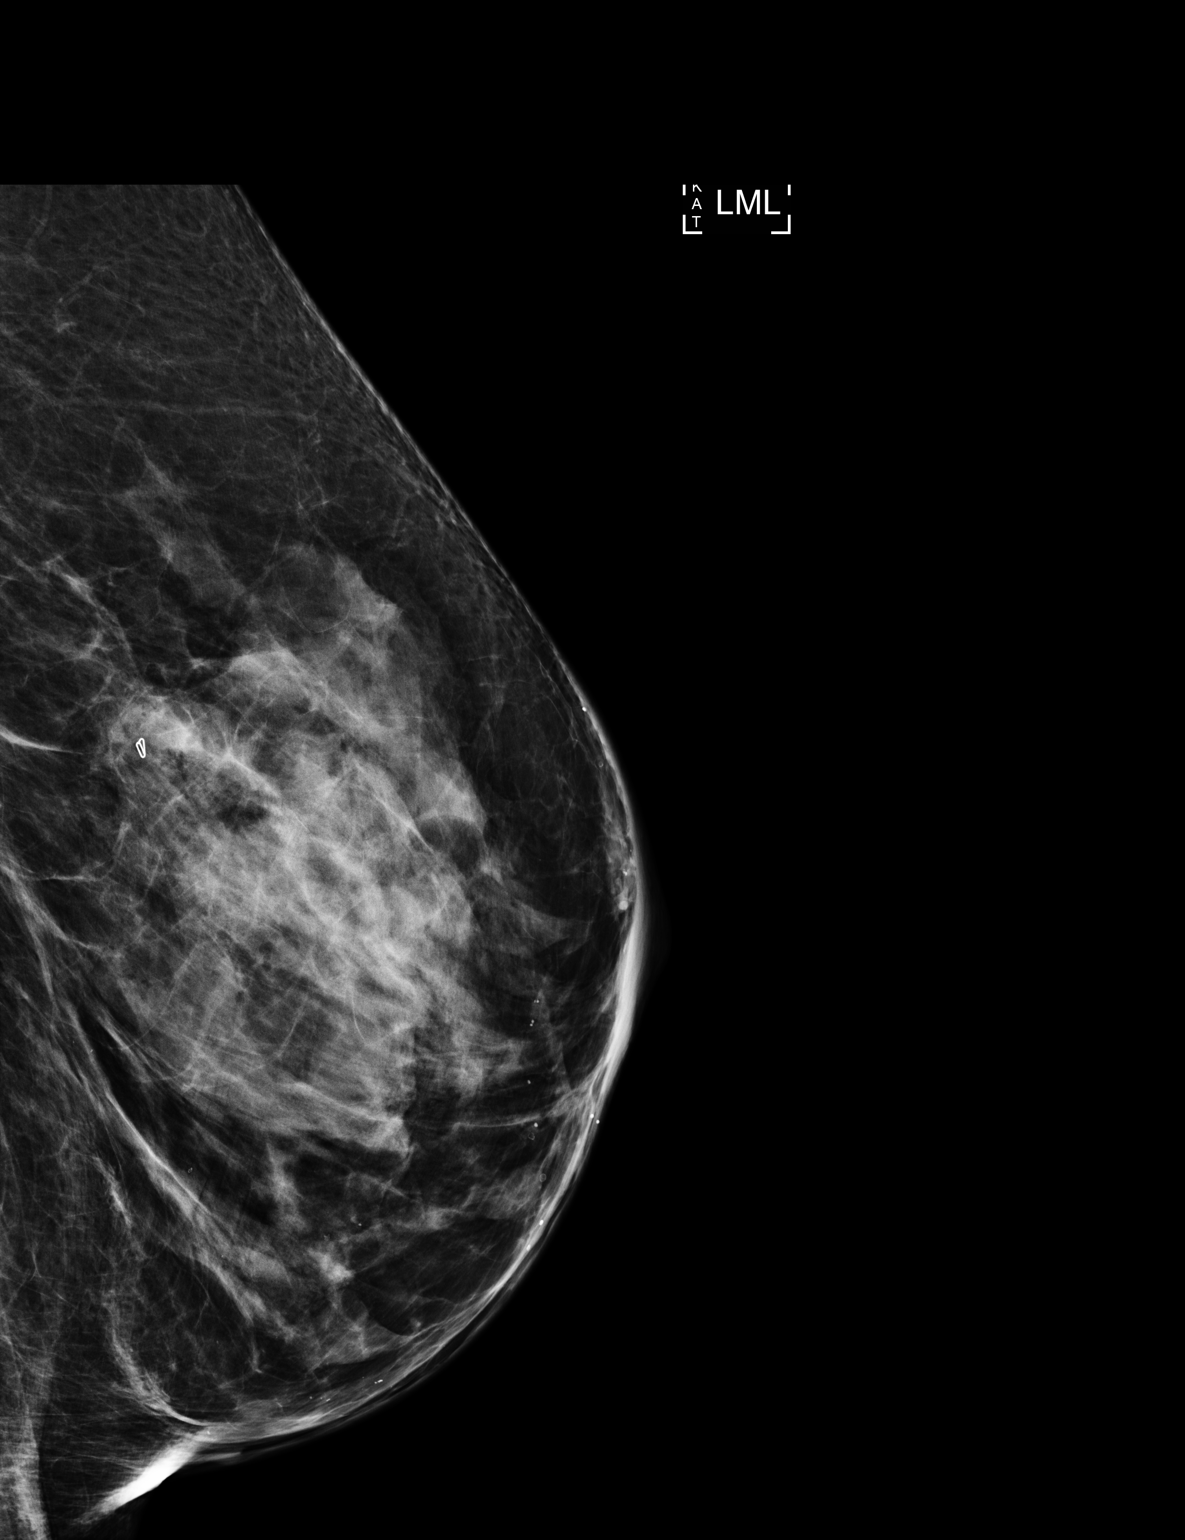

[L CC]
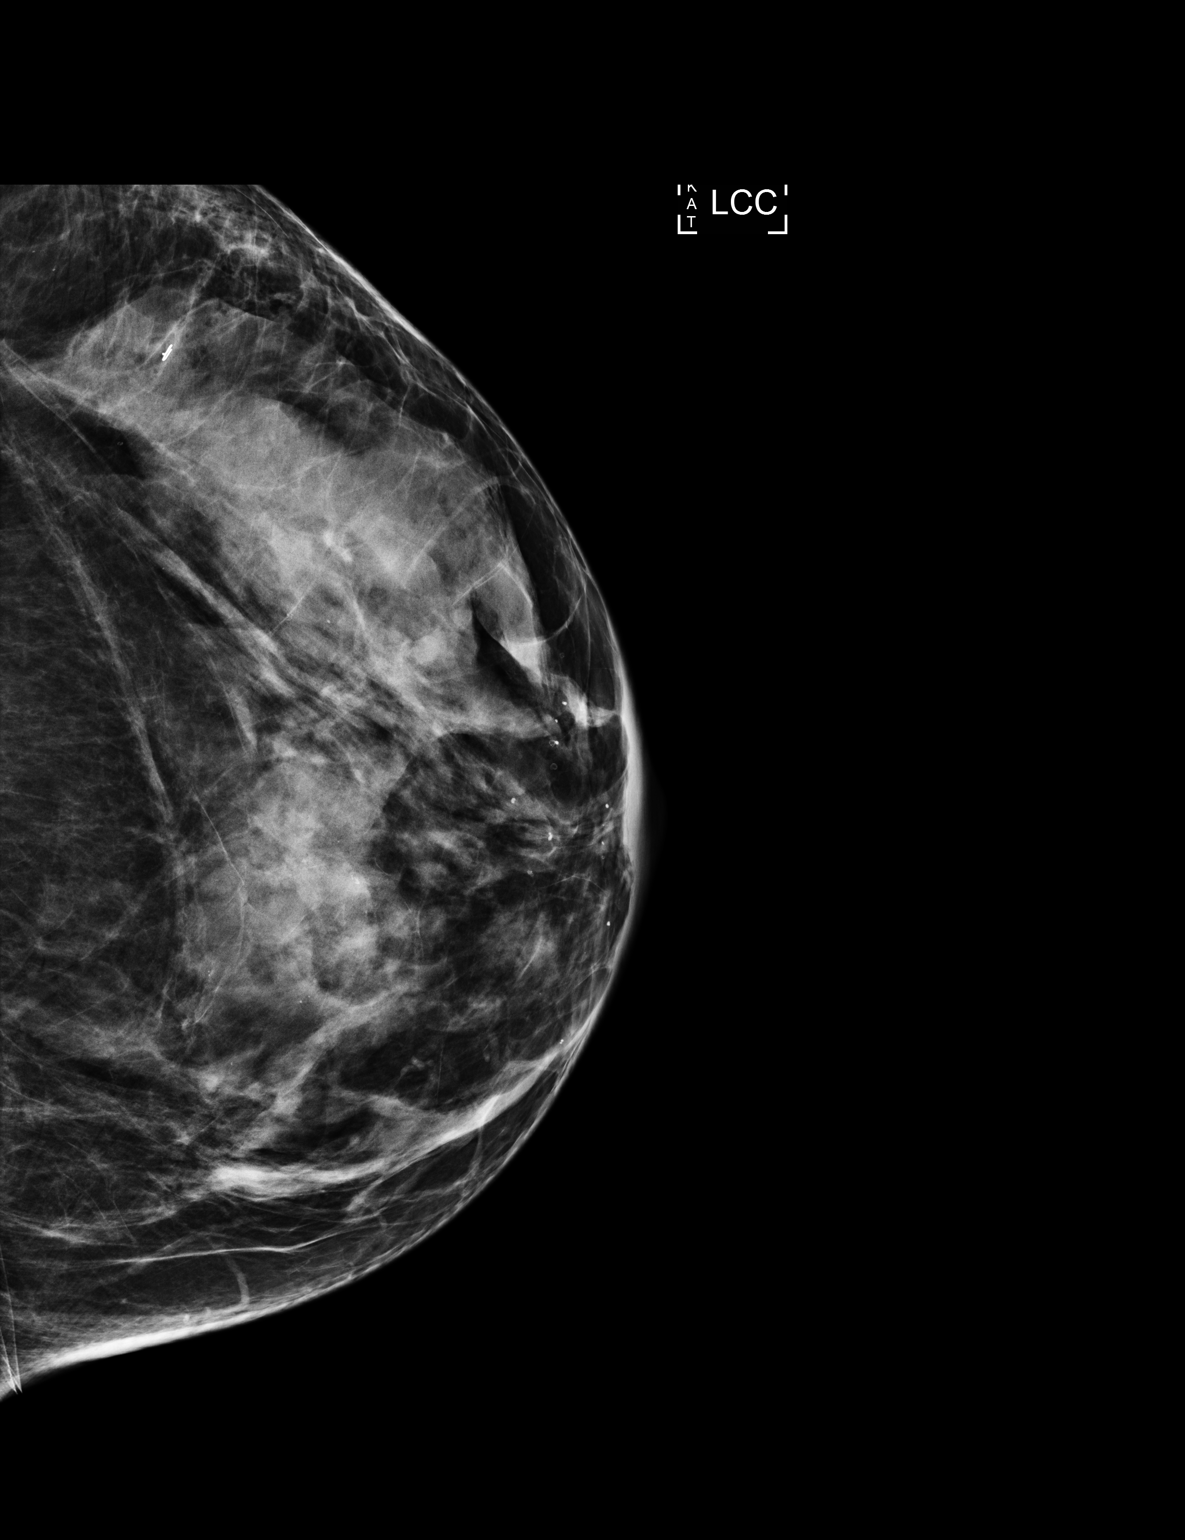

[3 of 3 positions shown; findings below may reference images not displayed]

FINDINGS: Mammographic images were obtained following ultrasound guided biopsy
of left breast mass at 2 o'clock. The heart shaped biopsy marking
clip is positioned in the upper-outer quadrant of the left breast,
however I suspect that this does not align with the lesion
identified on the original screening mammogram from 03/25/2017. It
is difficult to be certain, given the biopsy changes in the region.
IMPRESSION: 1. The heart shaped biopsy marking clip is positioned at the site of
biopsy in the upper-outer quadrant of the left breast.

2. I suspect the clip does not align with the mass identified on the
original screening mammogram. If the biopsy is benign, a follow-up
left breast mammogram will be performed after resolution of the
biopsy changes to re-evaluate this.

Final Assessment: Post Procedure Mammograms for Marker Placement

## 2018-11-23 ENCOUNTER — Encounter: Payer: Self-pay | Admitting: Neurology

## 2018-11-25 ENCOUNTER — Encounter: Payer: Self-pay | Admitting: Neurology

## 2018-11-25 ENCOUNTER — Other Ambulatory Visit: Payer: Self-pay | Admitting: Neurology

## 2018-11-25 ENCOUNTER — Other Ambulatory Visit: Payer: Self-pay

## 2018-11-25 ENCOUNTER — Ambulatory Visit (INDEPENDENT_AMBULATORY_CARE_PROVIDER_SITE_OTHER): Payer: BC Managed Care – PPO | Admitting: Neurology

## 2018-11-25 VITALS — BP 113/66 | HR 79 | Temp 96.9°F | Ht 62.5 in | Wt 136.0 lb

## 2018-11-25 DIAGNOSIS — H81399 Other peripheral vertigo, unspecified ear: Secondary | ICD-10-CM

## 2018-11-25 DIAGNOSIS — R296 Repeated falls: Secondary | ICD-10-CM

## 2018-11-25 MED ORDER — ALPRAZOLAM 0.5 MG PO TABS: 0.2500 mg | ORAL_TABLET | Freq: Two times a day (BID) | ORAL | 0 refills | Status: AC | PRN

## 2018-11-25 MED ORDER — ALPRAZOLAM 0.5 MG PO TABS: ORAL_TABLET | ORAL | 0 refills | Status: DC

## 2018-11-25 NOTE — Progress Notes (Signed)
Provider:  Larey Seat, M D  Referring Provider: Carol Ada, MD Primary Care Physician:  Shelly Ada, MD  Chief Complaint  Patient presents with   New Patient (Initial Visit)    pt with husband, rm 10.back in middle of sept, she fell bout 3-4 times unsure why. after started noting that she was having dizziness and disoriented at times. they wanted her to monitor blood pressure and her lab work has been fine. She notices that her head feels numb. she states that sensation happens with quick position changes. she has been having double vision and saw eye MD and she does have cataract on left eye. the symptoms are intermittent.    Other    developed rash that is itching located above left waist. she states appearance of shingles but not painful. she has had shingles before.     HPI:  [name] is a 63 y.o. female  and is seen here upon a referral from Dr. Carol Gonzalez for an evaluation of falls. She has a history of migraines.   [name] reports that she never fell before mid Septmember 2020, she remembers the first fall took place in the home, without a mechanical barrier, she felt suddenly unable to balance.  The second one was outside , on a playground with a recycled rubber foam floor, it was witnessed by others and her neighbor, a Therapist, sports, found her unsteady.  The third event was a stumble, a near fall.  The fourth was in the home, she lost her balance and fell , trying to hold on to a chair.  These all happened at different times of the day, not related to meals, not related to medication intake.   She had a EKG in February- at [facility] for a nutritional consult. No palpitations. No cardiac monitor yet. No EEG yet, No vertigo evaluation- she has described lightheadedness, and once seems to have had vertigo with a spinning sensation. She felt in motion, she can control it when not moving, triggered by sudden movements and by eye movements. Followed by a dull headache.       Review of Systems: Out of a complete 14 system review, the patient complains of only the following symptoms, and all other reviewed systems are negative. See above.   Social History   Socioeconomic History   Marital status: Married    Spouse name: Not on file   Number of children: 2   Years of education: Not on file   Highest education level: Not on file  Occupational History   Occupation: Pharmacist, hospital  Social Needs   Financial resource strain: Not on file   Food insecurity    Worry: Not on file    Inability: Not on file   Transportation needs    Medical: Not on file    Non-medical: Not on file  Tobacco Use   Smoking status: Never Smoker   Smokeless tobacco: Never Used  Substance and Sexual Activity   Alcohol use: No   Drug use: No   Sexual activity: Yes  Lifestyle   Physical activity    Days per week: Not on file    Minutes per session: Not on file   Stress: Not on file  Relationships   Social connections    Talks on phone: Not on file    Gets together: Not on file    Attends religious service: Not on file    Active member of club or organization: Not  on file    Attends meetings of clubs or organizations: Not on file    Relationship status: Not on file   Intimate partner violence    Fear of current or ex partner: Not on file    Emotionally abused: Not on file    Physically abused: Not on file    Forced sexual activity: Not on file  Other Topics Concern   Not on file  Social History Narrative   Lives in [place], [place] with husband.     Family History  Problem Relation Age of Onset   Heart attack Father 54   Heart attack Paternal Grandfather 51   Arrhythmia Mother        Atrial fibrillation   Breast cancer Neg Hx     Past Medical History:  Diagnosis Date   ADHD (attention deficit hyperactivity disorder)    Anemia    Anxiety    Chest pain    Hospital, April, 2013, no MI,  //   nuclear, outpatient, May 07, 2011, ejection  fraction 61%, normal   Fibromyalgia    Migraine headache    PONV (postoperative nausea and vomiting)    Vitamin D insufficiency     Past Surgical History:  Procedure Laterality Date   ABDOMINAL HYSTERECTOMY  2008   APPENDECTOMY  1990's   BREAST BIOPSY Left    REDUCTION MAMMAPLASTY  1990   TONSILLECTOMY AND ADENOIDECTOMY  1976    Current Outpatient Medications  Medication Sig Dispense Refill   amphetamine-dextroamphetamine (ADDERALL XR) 25 MG 24 hr capsule Take 25 mg by mouth every morning.     VITAMIN D PO Take 1 tablet by mouth daily.     No current facility-administered medications for this visit.     Allergies as of 11/25/2018 - Review Complete 11/25/2018  Allergen Reaction Noted   Codeine Other (See Comments) 04/23/2011    Vitals: BP 113/66 (BP Location: Left Arm, Patient Position: Standing)    Pulse 79    Temp (!) 96.9 F (36.1 C)    Ht 5' 2.5" (1.588 m)    Wt 136 lb (61.7 kg)    BMI 24.48 kg/m  Last Weight:  Wt Readings from Last 1 Encounters:  11/25/18 136 lb (61.7 kg)   Last Height:   Ht Readings from Last 1 Encounters:  11/25/18 5' 2.5" (1.588 m)    Physical exam:  General: The patient is awake, alert and appears not in acute distress. The patient is well groomed. Head: Normocephalic, atraumatic. Neck is supple. Mallampati 3, neck circumference:13.50 Cardiovascular:  Regular rate and rhythm , without distended neck veins. Respiratory: Lungs are clear . Skin:  Without evidence of edema, or rash Trunk: BMI is   Neurologic exam : The patient is awake and alert, oriented to place and time.  Memory subjective \ described as delayed . There is a normal attention span & concentration ability. Speech is fluent without  dysarthria, dysphonia or aphasia. Mood and affect are appropriate.  Cranial nerves: Pupils are equal and briskly reactive to light. Funduscopic exam without  evidence of pallor or edema. Early cataract.  Extraocular movements  in  vertical and horizontal planes intact and without nystagmus. Rapid head movement let to vertigo and imbalance with sensation of unsteadiness- gaze to left and upward was worse.  Visual fields by finger perimetry are intact. Hearing to finger rub intact.   Facial sensation intact to fine touch. Facial motor strength is symmetric and tongue and uvula move midline. Tongue protrusion into either  cheek is normal. Shoulder shrug is normal.   Motor exam:   Normal tone ,muscle bulk and symmetric  strength in all extremities.  Sensory:  normal.  Coordination: Rapid alternating movements in the fingers/hands were normal. Finger-to-nose maneuver  normal without evidence of ataxia, dysmetria or tremor.  Gait and station: Patient walks without assistive device and is able unassisted to climb up to the exam table. Strength within normal limits. Stance is stable and normal. Tandem gait is unfragmented. Romberg testing is negative   Deep tendon reflexes: in the  upper and lower extremities are symmetric and intact.   Assessment:  After physical and neurologic examination, review of laboratory studies, imaging, neurophysiology testing and pre-existing records, assessment is that of :   Vertigo of unknown origin, suspect benign- non central. Still needs a CT or MRI brain to rule out any abnormality, no head injuries,no thunderclap headaches.    Likely vestibular- orthostatics were normal   Also rule out arrhythmia. Cardiac monitor needed.   Plan:  Treatment plan and additional workup : MRI head , cardiac monitor, xanax 0.25 mg, prn and if all normal referral to vest rehab.   Rv with NP.    Porfirio Mylararmen Ladaisha Portillo MD 11/25/2018

## 2018-12-13 ENCOUNTER — Telehealth: Payer: Self-pay | Admitting: Neurology

## 2018-12-14 ENCOUNTER — Other Ambulatory Visit: Payer: BC Managed Care – PPO

## 2018-12-15 ENCOUNTER — Other Ambulatory Visit: Payer: Self-pay

## 2018-12-15 ENCOUNTER — Ambulatory Visit: Payer: BC Managed Care – PPO

## 2018-12-15 DIAGNOSIS — H81399 Other peripheral vertigo, unspecified ear: Secondary | ICD-10-CM

## 2018-12-20 ENCOUNTER — Encounter: Payer: Self-pay | Admitting: Neurology

## 2018-12-22 ENCOUNTER — Telehealth: Payer: Self-pay | Admitting: Neurology

## 2018-12-22 ENCOUNTER — Encounter: Payer: Self-pay | Admitting: Neurology

## 2018-12-22 ENCOUNTER — Other Ambulatory Visit: Payer: Self-pay | Admitting: Neurology

## 2018-12-22 DIAGNOSIS — R296 Repeated falls: Secondary | ICD-10-CM

## 2018-12-22 DIAGNOSIS — H81399 Other peripheral vertigo, unspecified ear: Secondary | ICD-10-CM

## 2018-12-22 NOTE — Telephone Encounter (Signed)
Referral has been placed for the patient for Physical therapy for vestibular rehab per Dr Dohmeier office note.

## 2018-12-22 NOTE — Telephone Encounter (Signed)
Pt has called asking for a call from RN to discuss a referral for PT to help get her vertigo under control.

## 2018-12-31 ENCOUNTER — Ambulatory Visit: Payer: BC Managed Care – PPO | Admitting: Cardiology

## 2019-01-05 DIAGNOSIS — H43813 Vitreous degeneration, bilateral: Secondary | ICD-10-CM | POA: Diagnosis not present

## 2019-01-05 DIAGNOSIS — H25811 Combined forms of age-related cataract, right eye: Secondary | ICD-10-CM | POA: Diagnosis not present

## 2019-01-05 DIAGNOSIS — H2512 Age-related nuclear cataract, left eye: Secondary | ICD-10-CM | POA: Diagnosis not present

## 2019-01-05 DIAGNOSIS — H04123 Dry eye syndrome of bilateral lacrimal glands: Secondary | ICD-10-CM | POA: Diagnosis not present

## 2019-01-26 ENCOUNTER — Ambulatory Visit: Payer: BC Managed Care – PPO | Admitting: Physical Therapy

## 2019-02-09 DIAGNOSIS — H2511 Age-related nuclear cataract, right eye: Secondary | ICD-10-CM | POA: Diagnosis not present

## 2019-02-22 DIAGNOSIS — H25811 Combined forms of age-related cataract, right eye: Secondary | ICD-10-CM | POA: Diagnosis not present

## 2019-02-22 DIAGNOSIS — H268 Other specified cataract: Secondary | ICD-10-CM | POA: Diagnosis not present

## 2019-02-22 DIAGNOSIS — H2511 Age-related nuclear cataract, right eye: Secondary | ICD-10-CM | POA: Diagnosis not present

## 2019-03-04 ENCOUNTER — Ambulatory Visit: Payer: BC Managed Care – PPO | Admitting: Cardiology

## 2019-03-17 NOTE — Telephone Encounter (Signed)
error 

## 2019-03-23 IMAGING — MG DIGITAL DIAGNOSTIC UNILATERAL LEFT MAMMOGRAM WITH TOMO AND CAD
4 series · 4 of 12 positions shown · non-contrast
Comparison: Previous exam(s).

CLINICAL DATA: 62-year-old female presenting for follow-up status
post benign biopsy of the left breast. The ultrasound-guided biopsy
site did not correspond with the asymmetry identified on the
mammogram, which appeared to have resolved on the post biopsy
mammogram clip placement image. The exam today is to follow up this
site and ensure that there is continued complete resolution.

EXAM:
DIGITAL DIAGNOSTIC UNILATERAL LEFT MAMMOGRAM WITH CAD AND TOMO

[L CC synth-2D]
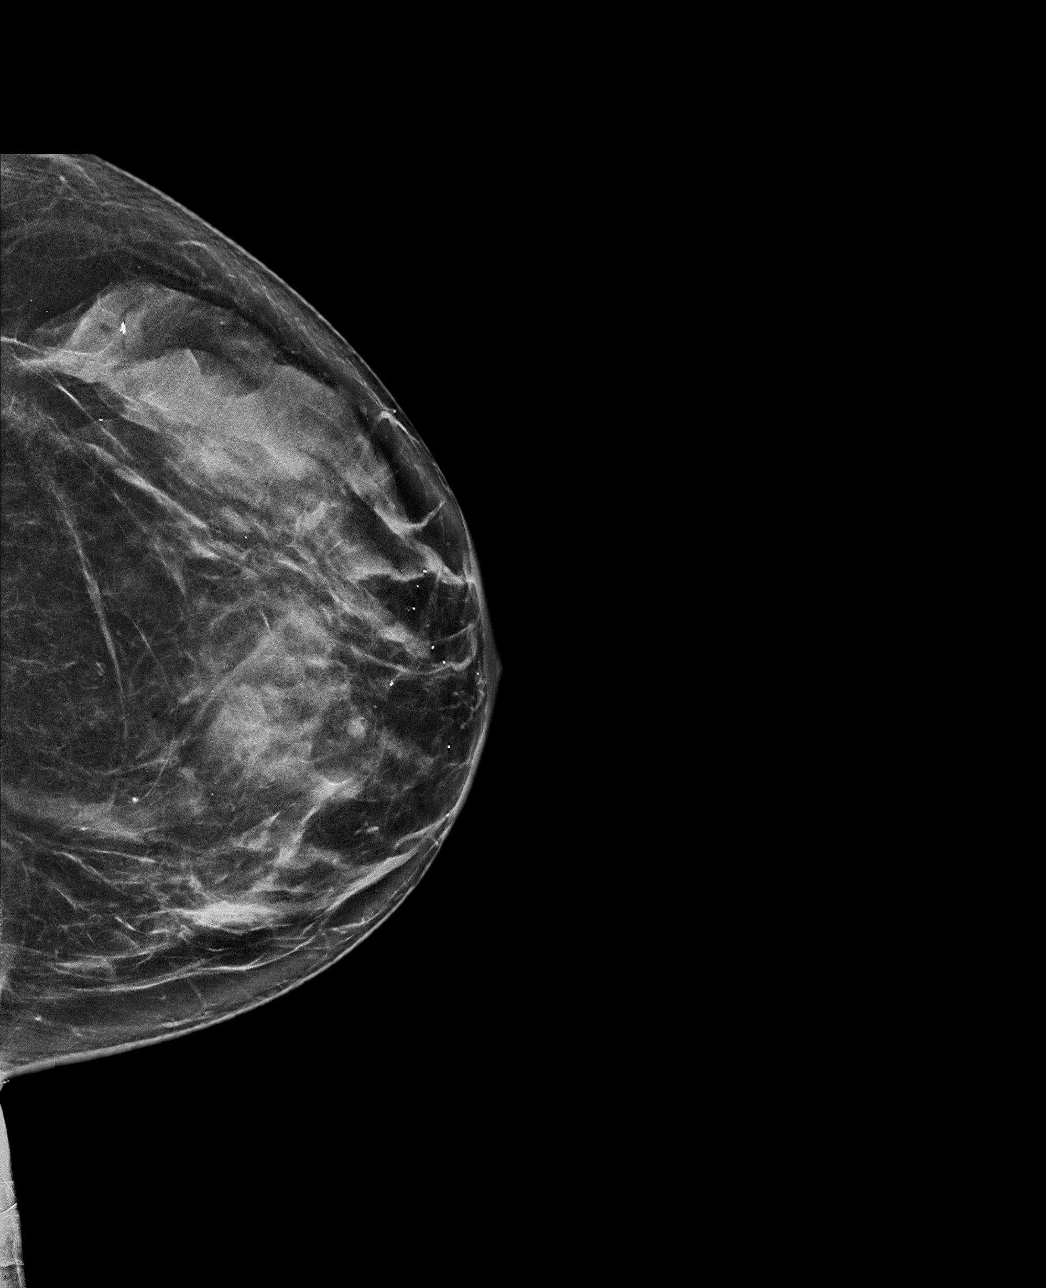

[L MLO synth-2D]
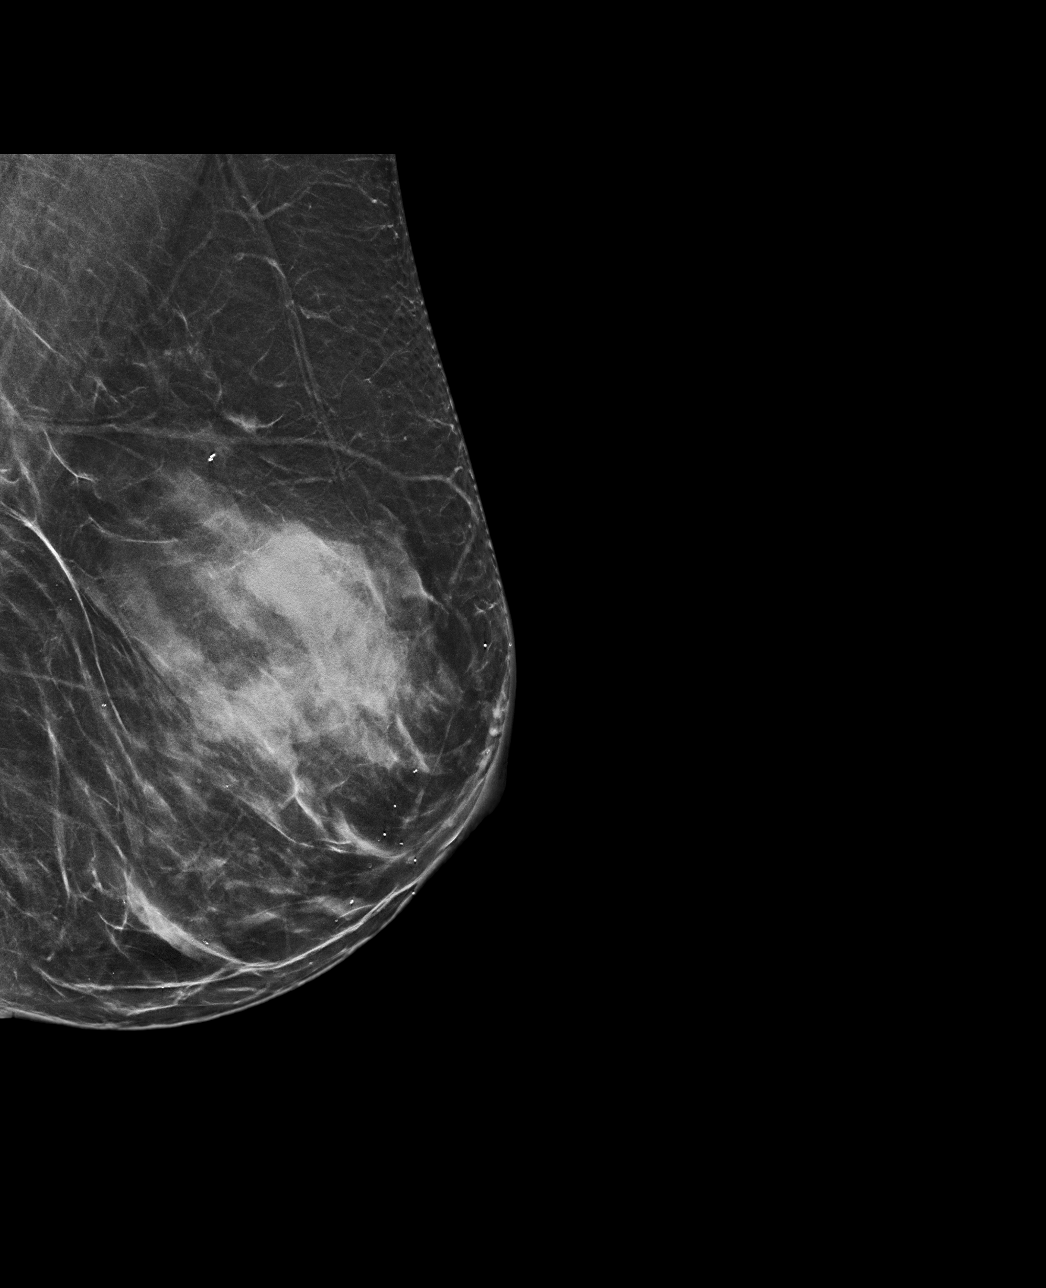

[L CC tomo · tomo slice 37/74.0]
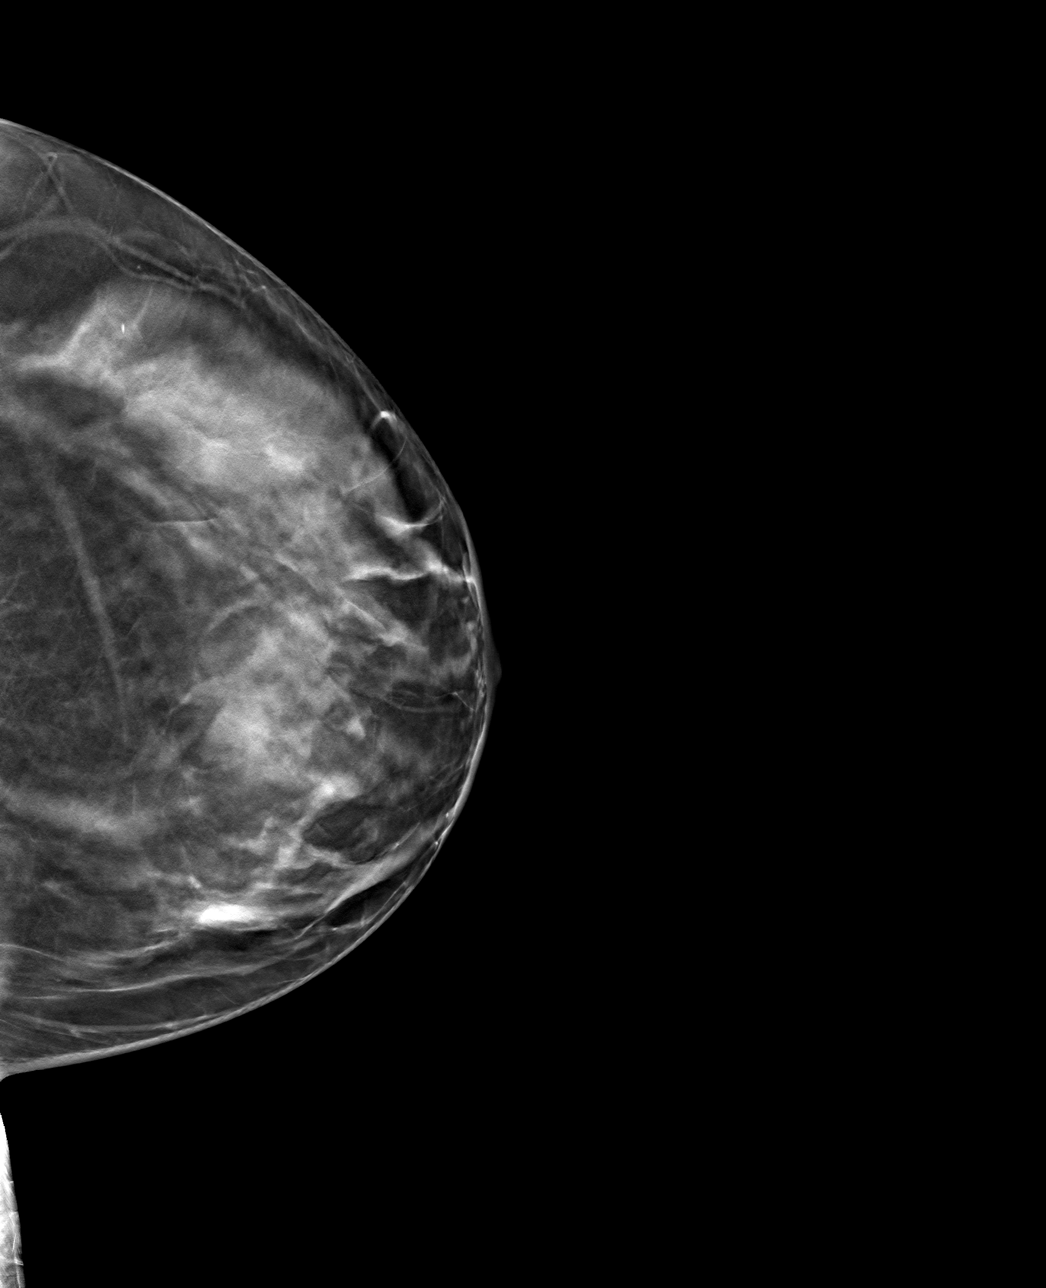

[L MLO tomo · tomo slice 37/73.0]
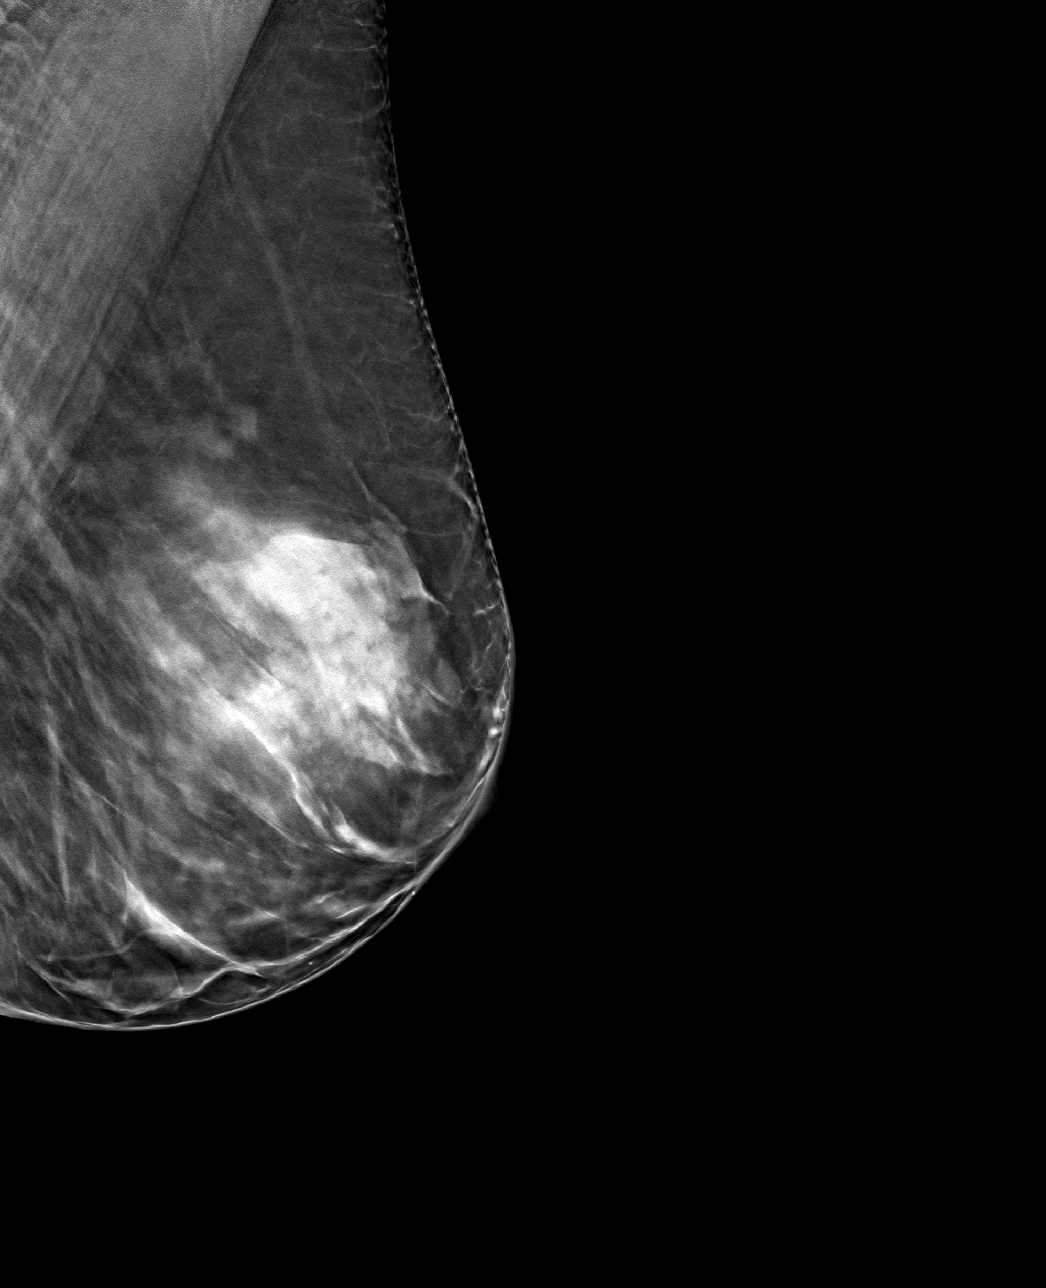

[4 of 12 positions shown; findings below may reference images not displayed]

ACR Breast Density Category d: The breast tissue is extremely dense,
which lowers the sensitivity of mammography.
FINDINGS: The heart shaped biopsy marking clip is again seen in the
upper-outer quadrant of the left breast. No suspicious changes are
seen surrounding the biopsy marker. Further in the upper-outer
quadrant no suspicious findings are seen at the location of the
previously seen asymmetry. No suspicious calcifications, masses or
areas of distortion are seen in the left breast.

Mammographic images were processed with CAD.
IMPRESSION: 1. The asymmetry initially identified on the screening mammogram in
Monday March, 2017 has resolved.

2. No suspicious mammographic changes are seen at the biopsy site in
the upper-outer left breast.

RECOMMENDATION:
Return to routine screening mammography is recommended. The patient
will be due for screening in Wednesday March, 2018.

I have discussed the findings and recommendations with the patient.
Results were also provided in writing at the conclusion of the
visit. If applicable, a reminder letter will be sent to the patient
regarding the next appointment.

BI-RADS CATEGORY  1: Negative.

## 2019-03-31 NOTE — Progress Notes (Deleted)
Cardiology Office Note   Date:  03/31/2019   ID:  [name], DOB July 08, 1955, MRN [id number]  PCP:  Carol Ada, MD  Cardiologist:   Larnie Heart Martinique, MD   No chief complaint on file.     History of Present Illness: [name] is a 64 y.o. female who is seen at the request of Dr Tamala Julian for evaluation of unexplained falls and vertigo. She was evaluated by Neuro- Dr Brett Fairy in November. MRI was normal. Consider event monitor +/- vestibular Rehab. Felt to likely have non central vertigo.     Past Medical History:  Diagnosis Date  . ADHD (attention deficit hyperactivity disorder)   . Anemia   . Anxiety   . Chest pain    Hospital, April, 2013, no MI,  //   nuclear, outpatient, May 07, 2011, ejection fraction 61%, normal  . Fibromyalgia   . Migraine headache   . PONV (postoperative nausea and vomiting)   . Vitamin D insufficiency     Past Surgical History:  Procedure Laterality Date  . ABDOMINAL HYSTERECTOMY  2008  . APPENDECTOMY  1990's  . BREAST BIOPSY Left   . REDUCTION MAMMAPLASTY  1990  . TONSILLECTOMY AND ADENOIDECTOMY  1976     Current Outpatient Medications  Medication Sig Dispense Refill  . ALPRAZolam (XANAX) 0.5 MG tablet Take 0.5 tablets (0.25 mg total) by mouth 2 (two) times daily as needed for anxiety. No more than 1 tablet total in 24 hr time frame/ only use as needed 30 tablet 0  . amphetamine-dextroamphetamine (ADDERALL XR) 25 MG 24 hr capsule Take 25 mg by mouth every morning.    Marland Kitchen VITAMIN D PO Take 1 tablet by mouth daily.     No current facility-administered medications for this visit.    Allergies:   Codeine    Social History:  The patient  reports that she has never smoked. She has never used smokeless tobacco. She reports that she does not drink alcohol or use drugs.   Family History:  The patient's ***family history includes Arrhythmia in her mother; Heart attack (age of onset: 93) in her father; Heart attack (age of onset: 85) in her  paternal grandfather.    ROS:  Please see the history of present illness.   Otherwise, review of systems are positive for {NONE DEFAULTED:18576::"none"}.   All other systems are reviewed and negative.    PHYSICAL EXAM: VS:  There were no vitals taken for this visit. , BMI There is no height or weight on file to calculate BMI. GEN: Well nourished, well developed, in no acute distress  HEENT: normal  Neck: no JVD, carotid bruits, or masses Cardiac: ***RRR; no murmurs, rubs, or gallops,no edema  Respiratory:  clear to auscultation bilaterally, normal work of breathing GI: soft, nontender, nondistended, + BS MS: no deformity or atrophy  Skin: warm and dry, no rash Neuro:  Strength and sensation are intact Psych: euthymic mood, full affect   EKG:  EKG {ACTION; IS/IS WFU:[id number]} ordered today. The ekg ordered today demonstrates ***   Recent Labs: No results found for requested labs within last 8760 hours.    Lipid Panel    Component Value Date/Time   CHOL 189 04/24/2011 0151   TRIG 84 04/24/2011 0151   HDL 57 04/24/2011 0151   CHOLHDL 3.3 04/24/2011 0151   VLDL 17 04/24/2011 0151   LDLCALC 115 (H) 04/24/2011 0151      Wt Readings from Last 3 Encounters:  11/25/18 136  lb (61.7 kg)  09/15/13 128 lb (58.1 kg)  05/09/11 128 lb (58.1 kg)      Other studies Reviewed: Additional studies/ records that were reviewed today include: ***. Review of the above records demonstrates: ***   ASSESSMENT AND PLAN:  1.  ***   Current medicines are reviewed at length with the patient today.  The patient {ACTIONS; HAS/DOES NOT HAVE:19233} concerns regarding medicines.  The following changes have been made:  {PLAN; NO CHANGE:13088:s}  Labs/ tests ordered today include: *** No orders of the defined types were placed in this encounter.    Disposition:   FU with *** in {gen number 5-68:616837} {Days to years:10300}  Signed, Shahzaib Azevedo [place], MD  03/31/2019 9:44 AM    [facility] [address] [place], [place], [address] Phone [phone], Fax [phone]  \

## 2019-04-06 ENCOUNTER — Ambulatory Visit: Payer: BC Managed Care – PPO | Admitting: Cardiology

## 2019-05-04 ENCOUNTER — Other Ambulatory Visit: Payer: Self-pay | Admitting: Orthopedic Surgery

## 2019-05-04 DIAGNOSIS — M19012 Primary osteoarthritis, left shoulder: Secondary | ICD-10-CM | POA: Diagnosis not present

## 2019-05-04 DIAGNOSIS — M19019 Primary osteoarthritis, unspecified shoulder: Secondary | ICD-10-CM

## 2019-05-25 ENCOUNTER — Ambulatory Visit
Admission: RE | Admit: 2019-05-25 | Discharge: 2019-05-25 | Disposition: A | Discharge status: Home / Self Care | Payer: BC Managed Care – PPO | Source: Ambulatory Visit | Attending: Orthopedic Surgery | Admitting: Orthopedic Surgery

## 2019-05-25 DIAGNOSIS — Z01818 Encounter for other preprocedural examination: Secondary | ICD-10-CM | POA: Diagnosis not present

## 2019-05-25 DIAGNOSIS — M19012 Primary osteoarthritis, left shoulder: Secondary | ICD-10-CM | POA: Diagnosis not present

## 2019-05-25 DIAGNOSIS — M19019 Primary osteoarthritis, unspecified shoulder: Secondary | ICD-10-CM

## 2019-06-07 DIAGNOSIS — Z01419 Encounter for gynecological examination (general) (routine) without abnormal findings: Secondary | ICD-10-CM | POA: Diagnosis not present

## 2019-06-07 DIAGNOSIS — Z6825 Body mass index (BMI) 25.0-25.9, adult: Secondary | ICD-10-CM | POA: Diagnosis not present

## 2019-06-27 DIAGNOSIS — F900 Attention-deficit hyperactivity disorder, predominantly inattentive type: Secondary | ICD-10-CM | POA: Diagnosis not present

## 2019-06-27 DIAGNOSIS — G4700 Insomnia, unspecified: Secondary | ICD-10-CM | POA: Diagnosis not present

## 2019-06-27 DIAGNOSIS — G43909 Migraine, unspecified, not intractable, without status migrainosus: Secondary | ICD-10-CM | POA: Diagnosis not present

## 2019-07-04 DIAGNOSIS — Z1231 Encounter for screening mammogram for malignant neoplasm of breast: Secondary | ICD-10-CM | POA: Diagnosis not present

## 2019-07-04 DIAGNOSIS — Z13820 Encounter for screening for osteoporosis: Secondary | ICD-10-CM | POA: Diagnosis not present

## 2019-07-29 NOTE — Pre-Procedure Instructions (Signed)
[name]  07/29/2019      [id number] - [address], [place] - [address] [name] [address] Phone: [phone] Fax: [phone]  [facility] - [place], [place] - [address] [name] [address] Phone: [phone] Fax: [phone]    Your procedure is scheduled on July 20  Report to [facility] at 10:30 A.M.  Call this number if you have problems the morning of surgery:  [phone]   Remember:  Do not eat  after midnight.   You may drink clear liquids until 9:30 A.M. the morning of surgery .  Clear liquids allowed are:                    Water, Juice (non-citric and without pulp - diabetics please choose diet or no sugar options), Carbonated beverages - (diabetics please choose diet or no sugar options), Clear Tea, Black Coffee only (no creamer, milk or cream including half and half), Plain Jell-O only (diabetics please choose diet or no sugar options), Gatorade (diabetics please choose diet or no sugar options) and Plain Popsicles only                   Enhanced Recovery after Surgery for Orthopedics Enhanced Recovery after Surgery is a protocol used to improve the stress on your body and your recovery after surgery.  Patient Instructions  . The night before surgery:  o No food after midnight. ONLY clear liquids after midnight  .  Marland Kitchen The day of surgery (if you do NOT have diabetes):  o Drink ONE (1) Pre-Surgery Clear Ensure by _9:30____ am the morning of surgery   o This drink was given to you during your hospital  pre-op appointment visit. o Nothing else to drink after completing the  Pre-Surgery Clear Ensure.           If you have questions, please contact your surgeon's office.     Take these medicines the morning of surgery with A SIP OF WATER :              If needed: alprozolam (xanax)       7 days prior to surgery STOP taking any Aspirin (unless otherwise  instructed by your surgeon), Aleve, Naproxen, Ibuprofen, Motrin, Advil, Goody's, BC's, all herbal medications, fish oil, and all vitamins.                    [place]- Preparing for Shoulder Surgery  ?  Before surgery, you can play an important role. Because skin is not sterile, your skin needs to be as free of germs as possible. You can reduce the number of germs on your skin by using the following products.   1). Benzoyl Peroxide Gel: reduces the number of germs present on the skin   *Applied twice a day to shoulder area starting two days before surgery     2). Chlorhexidine Gluconate (CHG) Soap: An antiseptic cleaner that kills germs and bonds with the skin to continue killing germs even after washing   *Used for showering the night before surgery and morning of surgery   ?    Please follow these instructions carefully:     1). BENZOYL PEROXIDE 5% GEL (Please do not use if you have an allergy to benzoyl peroxide.   If your skin becomes reddened/irritated stop using the benzoyl peroxide)  Starting TWO DAYS BEFORE surgery:    Apply benzoyl peroxide in the morning and at night. Apply after taking a shower. If you are not taking a shower clean entire shoulder front, back, and side along with the armpit with a clean wet washcloth.     Place a quarter-sized amount of gel on your shoulder and rub in thoroughly, making sure to cover the front, back, and side of your shoulder, along with the armpit.                           Do this twice a day for two days.  (Last application is the night before surgery, AFTER using the CHG soap as described below).   Do NOT apply benzoyl peroxide gel on the day of surgery.   2 days before ____ AM   ____ PM              1 day before ____ AM   ____ PM    2) CHG Soap: Please do not use if you have an allergy to CHG or antibacterial soaps. If your skin becomes reddened/irritated stop using the CHG.  Do not shave (including legs and  underarms) for at least 48 hours prior to first CHG shower. It is OK to shave your face.  Please follow these instructions carefully.   1. Shower the NIGHT BEFORE SURGERY (before applying benzoyl peroxide gel) and the MORNING OF SURGERY with CHG Soap.   2. If you chose to wash your hair, wash your hair first as usual with your normal shampoo.  3. After you shampoo, rinse your hair and body thoroughly to remove the shampoo.  4. Use CHG as you would any other liquid soap. You can apply CHG directly to the skin and wash gently with a scrungie or a clean washcloth.   5. Apply the CHG Soap to your body ONLY FROM THE NECK DOWN.  Do not use on open wounds or open sores. Avoid contact with your eyes, ears, mouth and genitals (private parts). Wash Face and genitals (private parts) with your normal soap.   6. Wash thoroughly, paying special attention to the area where your surgery will be performed.  7. Thoroughly rinse your body with warm water from the neck down.  8. DO NOT shower/wash with your normal soap after using and rinsing off the CHG Soap.  9. Pat yourself dry with a CLEAN TOWEL.  10. Wear CLEAN PAJAMAS to bed the night before surgery  11. Place CLEAN SHEETS on your bed the night of your first shower and DO NOT SLEEP WITH PETS.  Oral Hygiene is also important to reduce your risk of infection.  Remember - BRUSH YOUR TEETH THE MORNING OF SURGERY WITH YOUR REGULAR TOOTHPASTE  Day of Surgery: Wear Clean/Comfortable clothing the morning of surgery Do not apply any deodorants/lotions.   Remember to brush your teeth WITH YOUR REGULAR TOOTHPASTE.     Do not wear jewelry, make-up or nail polish.  Do not wear lotions, powders, or perfumes, or deodorant.  Do not shave 48 hours prior to surgery.  Men may shave face and neck.  Do not bring valuables to the hospital.  [facility] is not responsible for any belongings or valuables.  Contacts, dentures or bridgework may not be worn into  surgery.  Leave your suitcase in the car.  After surgery it may be brought to your room.  For patients admitted to the hospital, discharge  time will be determined by your treatment team.  Patients discharged the day of surgery will not be allowed to drive home.    Please read over the following fact sheets that you were given. Coughing and Deep Breathing and Surgical Site Infection Prevention

## 2019-08-01 ENCOUNTER — Other Ambulatory Visit (HOSPITAL_COMMUNITY)
Admission: RE | Admit: 2019-08-01 | Discharge: 2019-08-01 | Disposition: A | Discharge status: Home / Self Care | Payer: BC Managed Care – PPO | Source: Ambulatory Visit | Attending: Orthopedic Surgery | Admitting: Orthopedic Surgery

## 2019-08-01 ENCOUNTER — Other Ambulatory Visit: Payer: Self-pay

## 2019-08-01 ENCOUNTER — Encounter (HOSPITAL_COMMUNITY): Payer: Self-pay

## 2019-08-01 ENCOUNTER — Encounter (HOSPITAL_COMMUNITY)
Admission: RE | Admit: 2019-08-01 | Discharge: 2019-08-01 | Disposition: A | Discharge status: Home / Self Care | Payer: BC Managed Care – PPO | Source: Ambulatory Visit | Attending: Orthopedic Surgery | Admitting: Orthopedic Surgery

## 2019-08-01 DIAGNOSIS — Z01812 Encounter for preprocedural laboratory examination: Secondary | ICD-10-CM | POA: Diagnosis not present

## 2019-08-01 DIAGNOSIS — Z20822 Contact with and (suspected) exposure to covid-19: Secondary | ICD-10-CM | POA: Diagnosis not present

## 2019-08-01 HISTORY — DX: Age-related osteoporosis without current pathological fracture: M81.0

## 2019-08-01 LAB — CBC
HCT: 39.3 % (ref 36.0–46.0)
Hemoglobin: 12.3 g/dL (ref 12.0–15.0)
MCH: 30.8 pg (ref 26.0–34.0)
MCHC: 31.3 g/dL (ref 30.0–36.0)
MCV: 98.3 fL (ref 80.0–100.0)
Platelets: 226 10*3/uL (ref 150–400)
RBC: 4 MIL/uL (ref 3.87–5.11)
RDW: 12.1 % (ref 11.5–15.5)
WBC: 6.4 10*3/uL (ref 4.0–10.5)
nRBC: 0 % (ref 0.0–0.2)

## 2019-08-01 LAB — SURGICAL PCR SCREEN
MRSA, PCR: NEGATIVE
Staphylococcus aureus: NEGATIVE

## 2019-08-01 LAB — SARS CORONAVIRUS 2 (TAT 6-24 HRS): SARS Coronavirus 2: NEGATIVE

## 2019-08-01 NOTE — Anesthesia Preprocedure Evaluation (Addendum)
Anesthesia Evaluation  Patient identified by MRN, date of birth, ID band Patient awake    Reviewed: Allergy & Precautions, NPO status , Patient's Chart, lab work & pertinent test results  History of Anesthesia Complications (+) PONV and history of anesthetic complications  Airway Mallampati: II  TM Distance: >3 FB Neck ROM: Full    Dental no notable dental hx.    Pulmonary neg pulmonary ROS,    Pulmonary exam normal        Cardiovascular negative cardio ROS Normal cardiovascular exam     Neuro/Psych  Headaches, Anxiety ADHD   GI/Hepatic negative GI ROS, Neg liver ROS,   Endo/Other  negative endocrine ROS  Renal/GU negative Renal ROS  negative genitourinary   Musculoskeletal  (+) Fibromyalgia -  Abdominal   Peds  Hematology negative hematology ROS (+)   Anesthesia Other Findings Day of surgery medications reviewed with patient.  Reproductive/Obstetrics negative OB ROS                            Anesthesia Physical Anesthesia Plan  ASA: II  Anesthesia Plan: General   Post-op Pain Management: GA combined w/ Regional for post-op pain   Induction: Intravenous  PONV Risk Score and Plan: 4 or greater and Midazolam, Treatment may vary due to age or medical condition, Ondansetron, Dexamethasone, Propofol infusion, TIVA and Scopolamine patch - Pre-op  Airway Management Planned: Oral ETT  Additional Equipment: None  Intra-op Plan:   Post-operative Plan: Extubation in OR  Informed Consent: I have reviewed the patients History and Physical, chart, labs and discussed the procedure including the risks, benefits and alternatives for the proposed anesthesia with the patient or authorized representative who has indicated his/her understanding and acceptance.     Dental advisory given  Plan Discussed with: CRNA  Anesthesia Plan Comments:       Anesthesia Quick Evaluation

## 2019-08-01 NOTE — Progress Notes (Signed)
PCP - Dr. Merri Brunette Cardiologist - denies  Chest x-ray - N/A EKG - N/A Stress Test -05/08/11 -normal study ECHO - denies Cardiac Cath - denies  Sleep Study - denies CPAP - denies  Blood Thinner Instructions:N/A Aspirin Instructions:N/A  ERAS Protcol -Protocol initiated, pt instructed to stop clears by 0930 DOS.  PRE-SURGERY Ensure or G2- pt provided with one Pre-Surgical Ensure.   COVID TEST- Today after PAT appointment.    Anesthesia review: No  Patient denies shortness of breath, fever, cough and chest pain at PAT appointment   All instructions explained to the patient, with a verbal understanding of the material. Patient agrees to go over the instructions while at home for a better understanding. Patient also instructed to self quarantine after being tested for COVID-19. The opportunity to ask questions was provided.    Coronavirus Screening  Have you experienced the following symptoms:  Cough yes/no: No Fever (>100.80F)  yes/no: No Runny nose yes/no: No Sore throat yes/no: No Difficulty breathing/shortness of breath  yes/no: No  Have you or a family member traveled in the last 14 days and where? yes/no: No   If the patient indicates "YES" to the above questions, their PAT will be rescheduled to limit the exposure to others and, the surgeon will be notified. THE PATIENT WILL NEED TO BE ASYMPTOMATIC FOR 14 DAYS.   If the patient is not experiencing any of these symptoms, the PAT nurse will instruct them to NOT bring anyone with them to their appointment since they may have these symptoms or traveled as well.   Please remind your patients and families that hospital visitation restrictions are in effect and the importance of the restrictions.

## 2019-08-01 NOTE — Pre-Procedure Instructions (Signed)
[name]  08/01/2019     Your procedure is scheduled on Tuesday, July 20th.  Report to [facility] Entrance A at 10:30 A.M.  Call this number if you have problems the morning of surgery:  [phone]   Remember:  Do not eat after midnight.   You may drink clear liquids until 9:30 A.M. the morning of surgery . Clear liquids allowed are: Water, Non-Citrus Juices (without pulp), Carbonated Beverages, Clear Tea, Black Coffee Only, and Gatorade.                 Enhanced Recovery after Surgery for Orthopedics Enhanced Recovery after Surgery is a protocol used to improve the stress on your body and your recovery after surgery.  Patient Instructions  . The night before surgery:  o No food after midnight. ONLY clear liquids after midnight  .  Marland Kitchen The day of surgery (if you do NOT have diabetes):  o Drink ONE (1) Pre-Surgery Clear Ensure by 9:30 am the morning of surgery   o This drink was given to you during your hospital  pre-op appointment visit. o Nothing else to drink after completing the  Pre-Surgery Clear Ensure.         If you have questions, please contact your surgeon's office.     Take these medicines the morning of surgery with A SIP OF WATER :              If needed: alprozolam (xanax)  7 days prior to surgery STOP taking any Aspirin (unless otherwise instructed by your surgeon), Aleve, Naproxen, Ibuprofen, Motrin, Advil, Goody's, BC's, all herbal medications, fish oil, and all vitamins.                    [place]- Preparing for Shoulder Surgery  ?  Before surgery, you can play an important role. Because skin is not sterile, your skin needs to be as free of germs as possible. You can reduce the number of germs on your skin by using the following products.   1). Benzoyl Peroxide Gel: reduces the number of germs present on the skin   *Applied twice a day to shoulder area starting two days before surgery     2). Chlorhexidine Gluconate (CHG) Soap: An  antiseptic cleaner that kills germs and bonds with the skin to continue killing germs even after washing   *Used for showering the night before surgery and morning of surgery   ?    Please follow these instructions carefully:     1). BENZOYL PEROXIDE 5% GEL (Please do not use if you have an allergy to benzoyl peroxide.   If your skin becomes reddened/irritated stop using the benzoyl peroxide)     Starting TWO DAYS BEFORE surgery:    Apply benzoyl peroxide in the morning and at night. Apply after taking a shower. If you are not taking a shower clean entire shoulder front, back, and side along with the armpit with a clean wet washcloth.     Place a quarter-sized amount of gel on your shoulder and rub in thoroughly, making sure to cover the front, back, and side of your shoulder, along with the armpit.                           Do this twice a day for two days.  (Last application is the night before surgery, AFTER using the CHG soap as described below).  Do NOT apply benzoyl peroxide gel on the day of surgery.   2 days before ____ AM   ____ PM              1 day before ____ AM   ____ PM    2) CHG Soap: Please do not use if you have an allergy to CHG or antibacterial soaps. If your skin becomes reddened/irritated stop using the CHG.  Do not shave (including legs and underarms) for at least 48 hours prior to first CHG shower. It is OK to shave your face.  Please follow these instructions carefully.   1. Shower the NIGHT BEFORE SURGERY (before applying benzoyl peroxide gel) and the MORNING OF SURGERY with CHG Soap.   2. If you chose to wash your hair, wash your hair first as usual with your normal shampoo.  3. After you shampoo, rinse your hair and body thoroughly to remove the shampoo.  4. Use CHG as you would any other liquid soap. You can apply CHG directly to the skin and wash gently with a scrungie or a clean washcloth.   5. Apply the CHG Soap to your body ONLY FROM THE  NECK DOWN.  Do not use on open wounds or open sores. Avoid contact with your eyes, ears, mouth and genitals (private parts). Wash Face and genitals (private parts) with your normal soap.   6. Wash thoroughly, paying special attention to the area where your surgery will be performed.  7. Thoroughly rinse your body with warm water from the neck down.  8. DO NOT shower/wash with your normal soap after using and rinsing off the CHG Soap.  9. Pat yourself dry with a CLEAN TOWEL.  10. Wear CLEAN PAJAMAS to bed the night before surgery  11. Place CLEAN SHEETS on your bed the night of your first shower and DO NOT SLEEP WITH PETS.  Oral Hygiene is also important to reduce your risk of infection.  Remember - BRUSH YOUR TEETH THE MORNING OF SURGERY WITH YOUR REGULAR TOOTHPASTE  Day of Surgery: Wear Clean/Comfortable clothing the morning of surgery Do not apply any deodorants/lotions.   Remember to brush your teeth WITH YOUR REGULAR TOOTHPASTE.  Do not wear jewelry, make-up or nail polish.  Do not wear lotions, powders, or perfumes, or deodorant.  Do not shave 48 hours prior to surgery.   Do not bring valuables to the hospital.  [facility] is not responsible for any belongings or valuables.  Contacts, dentures or bridgework may not be worn into surgery.  Leave your suitcase in the car.  After surgery it may be brought to your room.  For patients admitted to the hospital, discharge time will be determined by your treatment team.  Patients discharged the day of surgery will not be allowed to drive home.    Please read over the following fact sheets that you were given. Coughing and Deep Breathing and Surgical Site Infection Prevention

## 2019-08-02 ENCOUNTER — Encounter (HOSPITAL_COMMUNITY): Payer: Self-pay | Admitting: Orthopedic Surgery

## 2019-08-02 ENCOUNTER — Ambulatory Visit (HOSPITAL_COMMUNITY): Payer: BC Managed Care – PPO | Admitting: Anesthesiology

## 2019-08-02 ENCOUNTER — Other Ambulatory Visit: Payer: Self-pay

## 2019-08-02 ENCOUNTER — Ambulatory Visit (HOSPITAL_COMMUNITY): Payer: BC Managed Care – PPO

## 2019-08-02 ENCOUNTER — Encounter (HOSPITAL_COMMUNITY)
Admission: RE | Disposition: A | Discharge status: Home / Self Care | Payer: Self-pay | Source: Home / Self Care | Attending: Orthopedic Surgery

## 2019-08-02 ENCOUNTER — Ambulatory Visit (HOSPITAL_COMMUNITY)
Admission: RE | Admit: 2019-08-02 | Discharge: 2019-08-03 | Disposition: A | Discharge status: Home / Self Care | Payer: BC Managed Care – PPO | Attending: Orthopedic Surgery | Admitting: Orthopedic Surgery

## 2019-08-02 DIAGNOSIS — Z9889 Other specified postprocedural states: Secondary | ICD-10-CM | POA: Diagnosis not present

## 2019-08-02 DIAGNOSIS — Z79899 Other long term (current) drug therapy: Secondary | ICD-10-CM | POA: Diagnosis not present

## 2019-08-02 DIAGNOSIS — F419 Anxiety disorder, unspecified: Secondary | ICD-10-CM | POA: Insufficient documentation

## 2019-08-02 DIAGNOSIS — M25712 Osteophyte, left shoulder: Secondary | ICD-10-CM | POA: Insufficient documentation

## 2019-08-02 DIAGNOSIS — Z885 Allergy status to narcotic agent status: Secondary | ICD-10-CM | POA: Diagnosis not present

## 2019-08-02 DIAGNOSIS — Z96619 Presence of unspecified artificial shoulder joint: Secondary | ICD-10-CM

## 2019-08-02 DIAGNOSIS — D649 Anemia, unspecified: Secondary | ICD-10-CM | POA: Diagnosis not present

## 2019-08-02 DIAGNOSIS — M19012 Primary osteoarthritis, left shoulder: Secondary | ICD-10-CM | POA: Diagnosis present

## 2019-08-02 DIAGNOSIS — Z471 Aftercare following joint replacement surgery: Secondary | ICD-10-CM | POA: Diagnosis not present

## 2019-08-02 DIAGNOSIS — G8918 Other acute postprocedural pain: Secondary | ICD-10-CM | POA: Diagnosis not present

## 2019-08-02 DIAGNOSIS — F909 Attention-deficit hyperactivity disorder, unspecified type: Secondary | ICD-10-CM | POA: Diagnosis not present

## 2019-08-02 DIAGNOSIS — M810 Age-related osteoporosis without current pathological fracture: Secondary | ICD-10-CM | POA: Diagnosis not present

## 2019-08-02 DIAGNOSIS — Z96612 Presence of left artificial shoulder joint: Secondary | ICD-10-CM | POA: Diagnosis not present

## 2019-08-02 HISTORY — PX: TOTAL SHOULDER ARTHROPLASTY: SHX126

## 2019-08-02 SURGERY — ARTHROPLASTY, SHOULDER, TOTAL
Anesthesia: Regional | Site: Shoulder | Laterality: Left

## 2019-08-02 MED ORDER — CHLORHEXIDINE GLUCONATE 0.12 % MT SOLN
15.0000 mL | Freq: Once | OROMUCOSAL | Status: AC
  Administered 2019-08-02: 15 mL via OROMUCOSAL
  Filled 2019-08-02: qty 15

## 2019-08-02 MED ORDER — TRANEXAMIC ACID-NACL 1000-0.7 MG/100ML-% IV SOLN: 1000.0000 mg | Freq: Once | INTRAVENOUS | Status: DC

## 2019-08-02 MED ORDER — CEFAZOLIN SODIUM-DEXTROSE 1-4 GM/50ML-% IV SOLN
1.0000 g | Freq: Four times a day (QID) | INTRAVENOUS | Status: AC
  Administered 2019-08-02 – 2019-08-03 (×3): 1 g via INTRAVENOUS
  Filled 2019-08-02 (×3): qty 50

## 2019-08-02 MED ORDER — LIDOCAINE 2% (20 MG/ML) 5 ML SYRINGE
INTRAMUSCULAR | Status: AC
  Filled 2019-08-02: qty 5

## 2019-08-02 MED ORDER — FENTANYL CITRATE (PF) 100 MCG/2ML IJ SOLN: 100.0000 ug | Freq: Once | INTRAMUSCULAR | Status: AC

## 2019-08-02 MED ORDER — ASPIRIN EC 325 MG PO TBEC
325.0000 mg | DELAYED_RELEASE_TABLET | Freq: Every day | ORAL | Status: DC
  Administered 2019-08-03: 325 mg via ORAL
  Filled 2019-08-02 (×2): qty 1

## 2019-08-02 MED ORDER — SUGAMMADEX SODIUM 200 MG/2ML IV SOLN
INTRAVENOUS | Status: DC | PRN
  Administered 2019-08-02: 100 mg via INTRAVENOUS
  Administered 2019-08-02 (×2): 50 mg via INTRAVENOUS

## 2019-08-02 MED ORDER — ROCURONIUM BROMIDE 10 MG/ML (PF) SYRINGE
PREFILLED_SYRINGE | INTRAVENOUS | Status: DC | PRN
  Administered 2019-08-02: 10 mg via INTRAVENOUS
  Administered 2019-08-02: 50 mg via INTRAVENOUS
  Administered 2019-08-02: 20 mg via INTRAVENOUS

## 2019-08-02 MED ORDER — PROPOFOL 500 MG/50ML IV EMUL
INTRAVENOUS | Status: DC | PRN
Start: 2019-08-02 — End: 2019-08-02
  Administered 2019-08-02: 100 ug/kg/min via INTRAVENOUS

## 2019-08-02 MED ORDER — PROMETHAZINE HCL 25 MG/ML IJ SOLN: 6.2500 mg | INTRAMUSCULAR | Status: DC | PRN

## 2019-08-02 MED ORDER — METOCLOPRAMIDE HCL 5 MG/ML IJ SOLN: 5.0000 mg | Freq: Three times a day (TID) | INTRAMUSCULAR | Status: DC | PRN

## 2019-08-02 MED ORDER — BUPIVACAINE LIPOSOME 1.3 % IJ SUSP
INTRAMUSCULAR | Status: DC | PRN
Start: 2019-08-02 — End: 2019-08-02
  Administered 2019-08-02: 10 mL via PERINEURAL

## 2019-08-02 MED ORDER — DOCUSATE SODIUM 100 MG PO CAPS
100.0000 mg | ORAL_CAPSULE | Freq: Two times a day (BID) | ORAL | Status: DC
  Administered 2019-08-02 – 2019-08-03 (×2): 100 mg via ORAL
  Filled 2019-08-02 (×2): qty 1

## 2019-08-02 MED ORDER — ACETAMINOPHEN 500 MG PO TABS
ORAL_TABLET | ORAL | Status: AC
  Administered 2019-08-02: 1000 mg via ORAL
  Filled 2019-08-02: qty 2

## 2019-08-02 MED ORDER — FENTANYL CITRATE (PF) 100 MCG/2ML IJ SOLN
INTRAMUSCULAR | Status: AC
  Administered 2019-08-02: 100 ug via INTRAVENOUS
  Filled 2019-08-02: qty 2

## 2019-08-02 MED ORDER — DEXAMETHASONE SODIUM PHOSPHATE 10 MG/ML IJ SOLN
INTRAMUSCULAR | Status: DC | PRN
Start: 2019-08-02 — End: 2019-08-02
  Administered 2019-08-02: 10 mg via INTRAVENOUS

## 2019-08-02 MED ORDER — PROPOFOL 10 MG/ML IV BOLUS
INTRAVENOUS | Status: AC
  Filled 2019-08-02: qty 40

## 2019-08-02 MED ORDER — PHENYLEPHRINE HCL-NACL 10-0.9 MG/250ML-% IV SOLN
INTRAVENOUS | Status: DC | PRN
Start: 2019-08-02 — End: 2019-08-02
  Administered 2019-08-02: 60 ug/min via INTRAVENOUS

## 2019-08-02 MED ORDER — TRANEXAMIC ACID-NACL 1000-0.7 MG/100ML-% IV SOLN
INTRAVENOUS | Status: AC
  Filled 2019-08-02: qty 100

## 2019-08-02 MED ORDER — ACETAMINOPHEN 500 MG PO TABS: 1000.0000 mg | ORAL_TABLET | Freq: Once | ORAL | Status: AC

## 2019-08-02 MED ORDER — MIDAZOLAM HCL 2 MG/2ML IJ SOLN: 1.0000 mg | Freq: Once | INTRAMUSCULAR | Status: AC

## 2019-08-02 MED ORDER — MIDAZOLAM HCL 2 MG/2ML IJ SOLN
INTRAMUSCULAR | Status: AC
  Filled 2019-08-02: qty 2

## 2019-08-02 MED ORDER — PROPOFOL 10 MG/ML IV BOLUS
INTRAVENOUS | Status: DC | PRN
  Administered 2019-08-02: 130 mg via INTRAVENOUS

## 2019-08-02 MED ORDER — METHOCARBAMOL 1000 MG/10ML IJ SOLN
500.0000 mg | Freq: Four times a day (QID) | INTRAVENOUS | Status: DC | PRN
  Filled 2019-08-02: qty 5

## 2019-08-02 MED ORDER — ONDANSETRON HCL 4 MG/2ML IJ SOLN: 4.0000 mg | Freq: Four times a day (QID) | INTRAMUSCULAR | Status: DC | PRN

## 2019-08-02 MED ORDER — CEFAZOLIN SODIUM-DEXTROSE 2-4 GM/100ML-% IV SOLN
INTRAVENOUS | Status: AC
  Filled 2019-08-02: qty 100

## 2019-08-02 MED ORDER — CEFAZOLIN SODIUM-DEXTROSE 2-4 GM/100ML-% IV SOLN
2.0000 g | INTRAVENOUS | Status: AC
  Administered 2019-08-02: 2 g via INTRAVENOUS

## 2019-08-02 MED ORDER — ACETAMINOPHEN 500 MG PO TABS
500.0000 mg | ORAL_TABLET | Freq: Four times a day (QID) | ORAL | Status: AC
  Administered 2019-08-02 – 2019-08-03 (×4): 500 mg via ORAL
  Filled 2019-08-02 (×4): qty 1

## 2019-08-02 MED ORDER — ONDANSETRON HCL 4 MG/2ML IJ SOLN
INTRAMUSCULAR | Status: DC | PRN
  Administered 2019-08-02: 4 mg via INTRAVENOUS

## 2019-08-02 MED ORDER — HYDROCODONE-ACETAMINOPHEN 7.5-325 MG PO TABS: 1.0000 | ORAL_TABLET | ORAL | Status: DC | PRN

## 2019-08-02 MED ORDER — HYDROCODONE-ACETAMINOPHEN 5-325 MG PO TABS: 1.0000 | ORAL_TABLET | ORAL | Status: DC | PRN

## 2019-08-02 MED ORDER — ONDANSETRON HCL 4 MG/2ML IJ SOLN
INTRAMUSCULAR | Status: AC
  Filled 2019-08-02: qty 2

## 2019-08-02 MED ORDER — ZOLPIDEM TARTRATE 5 MG PO TABS: 5.0000 mg | ORAL_TABLET | Freq: Every evening | ORAL | Status: DC | PRN

## 2019-08-02 MED ORDER — BUPIVACAINE-EPINEPHRINE (PF) 0.5% -1:200000 IJ SOLN
INTRAMUSCULAR | Status: DC | PRN
  Administered 2019-08-02: 15 mL via PERINEURAL

## 2019-08-02 MED ORDER — OXYCODONE HCL 5 MG/5ML PO SOLN: 5.0000 mg | Freq: Once | ORAL | Status: DC | PRN

## 2019-08-02 MED ORDER — MENTHOL 3 MG MT LOZG: 1.0000 | LOZENGE | OROMUCOSAL | Status: DC | PRN

## 2019-08-02 MED ORDER — OXYCODONE HCL 5 MG PO TABS: 5.0000 mg | ORAL_TABLET | Freq: Once | ORAL | Status: DC | PRN

## 2019-08-02 MED ORDER — ORAL CARE MOUTH RINSE: 15.0000 mL | Freq: Once | OROMUCOSAL | Status: AC

## 2019-08-02 MED ORDER — VANCOMYCIN HCL 1000 MG IV SOLR
INTRAVENOUS | Status: AC
  Filled 2019-08-02: qty 1000

## 2019-08-02 MED ORDER — SCOPOLAMINE 1 MG/3DAYS TD PT72
1.0000 | MEDICATED_PATCH | Freq: Once | TRANSDERMAL | Status: DC
  Administered 2019-08-02: 1.5 mg via TRANSDERMAL
  Filled 2019-08-02: qty 1

## 2019-08-02 MED ORDER — ACETAMINOPHEN 325 MG PO TABS: 325.0000 mg | ORAL_TABLET | Freq: Four times a day (QID) | ORAL | Status: DC | PRN

## 2019-08-02 MED ORDER — MORPHINE SULFATE (PF) 2 MG/ML IV SOLN: 0.5000 mg | INTRAVENOUS | Status: DC | PRN

## 2019-08-02 MED ORDER — ONDANSETRON HCL 4 MG PO TABS: 4.0000 mg | ORAL_TABLET | Freq: Four times a day (QID) | ORAL | Status: DC | PRN

## 2019-08-02 MED ORDER — METHOCARBAMOL 500 MG PO TABS: 500.0000 mg | ORAL_TABLET | Freq: Four times a day (QID) | ORAL | Status: DC | PRN

## 2019-08-02 MED ORDER — ROCURONIUM BROMIDE 10 MG/ML (PF) SYRINGE
PREFILLED_SYRINGE | INTRAVENOUS | Status: AC
  Filled 2019-08-02: qty 10

## 2019-08-02 MED ORDER — METOCLOPRAMIDE HCL 5 MG PO TABS: 5.0000 mg | ORAL_TABLET | Freq: Three times a day (TID) | ORAL | Status: DC | PRN

## 2019-08-02 MED ORDER — MIDAZOLAM HCL 2 MG/2ML IJ SOLN
INTRAMUSCULAR | Status: AC
  Administered 2019-08-02: 1 mg via INTRAVENOUS
  Filled 2019-08-02: qty 2

## 2019-08-02 MED ORDER — AMPHETAMINE-DEXTROAMPHET ER 25 MG PO CP24: 25.0000 mg | ORAL_CAPSULE | ORAL | Status: DC

## 2019-08-02 MED ORDER — MIDAZOLAM HCL 5 MG/5ML IJ SOLN
INTRAMUSCULAR | Status: DC | PRN
  Administered 2019-08-02: 2 mg via INTRAVENOUS

## 2019-08-02 MED ORDER — 0.9 % SODIUM CHLORIDE (POUR BTL) OPTIME
TOPICAL | Status: DC | PRN
  Administered 2019-08-02: 1000 mL

## 2019-08-02 MED ORDER — LIDOCAINE 2% (20 MG/ML) 5 ML SYRINGE
INTRAMUSCULAR | Status: DC | PRN
  Administered 2019-08-02: 60 mg via INTRAVENOUS

## 2019-08-02 MED ORDER — TRANEXAMIC ACID-NACL 1000-0.7 MG/100ML-% IV SOLN
1000.0000 mg | INTRAVENOUS | Status: AC
  Administered 2019-08-02: 1000 mg via INTRAVENOUS

## 2019-08-02 MED ORDER — DEXAMETHASONE SODIUM PHOSPHATE 10 MG/ML IJ SOLN
INTRAMUSCULAR | Status: AC
  Filled 2019-08-02: qty 1

## 2019-08-02 MED ORDER — FENTANYL CITRATE (PF) 250 MCG/5ML IJ SOLN
INTRAMUSCULAR | Status: AC
  Filled 2019-08-02: qty 5

## 2019-08-02 MED ORDER — LACTATED RINGERS IV SOLN: INTRAVENOUS | Status: DC

## 2019-08-02 MED ORDER — PHENOL 1.4 % MT LIQD: 1.0000 | OROMUCOSAL | Status: DC | PRN

## 2019-08-02 MED ORDER — VANCOMYCIN HCL 1000 MG IV SOLR
INTRAVENOUS | Status: DC | PRN
  Administered 2019-08-02: 1 g via TOPICAL

## 2019-08-02 MED ORDER — FENTANYL CITRATE (PF) 100 MCG/2ML IJ SOLN: 25.0000 ug | INTRAMUSCULAR | Status: DC | PRN

## 2019-08-02 SURGICAL SUPPLY — 59 items
AID PSTN UNV HD RSTRNT DISP (MISCELLANEOUS) ×1
ALCOHOL 70% 16 OZ (MISCELLANEOUS) ×3 IMPLANT
ANCH SUT 17.9 PEEK SWLK (Orthopedic Implant) ×1 IMPLANT
BLADE SAG 18X100X1.27 (BLADE) ×3 IMPLANT
BODY TRUNION ECLIPSE 39 SL (Shoulder) ×3 IMPLANT
CEMENT BONE REFOBACIN R1X40 US (Cement) ×3 IMPLANT
CLOSURE WOUND 1/2 X4 (GAUZE/BANDAGES/DRESSINGS) ×1
COVER SURGICAL LIGHT HANDLE (MISCELLANEOUS) ×3 IMPLANT
COVER WAND RF STERILE (DRAPES) ×3 IMPLANT
DRAPE INCISE IOBAN 66X45 STRL (DRAPES) ×6 IMPLANT
DRAPE ORTHO SPLIT 77X108 STRL (DRAPES) ×6
DRAPE SURG 17X11 SM STRL (DRAPES) ×3 IMPLANT
DRAPE SURG ORHT 6 SPLT 77X108 (DRAPES) ×2 IMPLANT
DRAPE U-SHAPE 47X51 STRL (DRAPES) ×3 IMPLANT
DRSG ADAPTIC 3X8 NADH LF (GAUZE/BANDAGES/DRESSINGS) ×3 IMPLANT
DRSG MEPILEX BORDER 4X8 (GAUZE/BANDAGES/DRESSINGS) ×3 IMPLANT
DRSG PAD ABDOMINAL 8X10 ST (GAUZE/BANDAGES/DRESSINGS) ×3 IMPLANT
DURAPREP 26ML APPLICATOR (WOUND CARE) ×3 IMPLANT
ELECT BLADE 4.0 EZ CLEAN MEGAD (MISCELLANEOUS) ×3
ELECT REM PT RETURN 9FT ADLT (ELECTROSURGICAL) ×3
ELECTRODE BLDE 4.0 EZ CLN MEGD (MISCELLANEOUS) ×1 IMPLANT
ELECTRODE REM PT RTRN 9FT ADLT (ELECTROSURGICAL) ×1 IMPLANT
GAUZE SPONGE 4X4 12PLY STRL (GAUZE/BANDAGES/DRESSINGS) ×3 IMPLANT
GLENOID WITH CLEAT SM (Miscellaneous) ×3 IMPLANT
GLOVE BIO SURGEON STRL SZ7.5 (GLOVE) ×3 IMPLANT
GLOVE BIOGEL PI IND STRL 8 (GLOVE) ×1 IMPLANT
GLOVE BIOGEL PI INDICATOR 8 (GLOVE) ×2
GOWN STRL REUS W/ TWL XL LVL3 (GOWN DISPOSABLE) ×2 IMPLANT
GOWN STRL REUS W/TWL XL LVL3 (GOWN DISPOSABLE) ×6
HEAD HUMERAL ECLIPSE 41/18 (Shoulder) ×3 IMPLANT
IMPL ECLIPSE SPEEDCAP (Shoulder) ×1 IMPLANT
IMPLANT ECLIPSE SPEEDCAP (Shoulder) ×3 IMPLANT
KIT BASIN OR (CUSTOM PROCEDURE TRAY) ×3 IMPLANT
KIT TURNOVER KIT B (KITS) ×3 IMPLANT
MANIFOLD NEPTUNE II (INSTRUMENTS) ×3 IMPLANT
NEEDLE TAPERED W/ NITINOL LOOP (MISCELLANEOUS) ×3 IMPLANT
NS IRRIG 1000ML POUR BTL (IV SOLUTION) ×3 IMPLANT
PACK SHOULDER (CUSTOM PROCEDURE TRAY) ×3 IMPLANT
PAD ARMBOARD 7.5X6 YLW CONV (MISCELLANEOUS) ×6 IMPLANT
PEEK SWIVELOCK SHOU 3.9 (Orthopedic Implant) ×3 IMPLANT
PIN NITINOL TARGETER 2.8 (PIN) ×3 IMPLANT
RESTRAINT HEAD UNIVERSAL NS (MISCELLANEOUS) ×3 IMPLANT
SCREW SM FOR ECLIPSE CAGE 30 (Screw) ×3 IMPLANT
SIZER ECLIPSE CAGE SCREW (ORTHOPEDIC DISPOSABLE SUPPLIES) ×3 IMPLANT
SLING ARM FOAM STRAP LRG (SOFTGOODS) ×3 IMPLANT
SMARTMIX MINI TOWER (MISCELLANEOUS) ×3
SPONGE LAP 18X18 RF (DISPOSABLE) ×3 IMPLANT
STRIP CLOSURE SKIN 1/2X4 (GAUZE/BANDAGES/DRESSINGS) ×2 IMPLANT
SUCTION FRAZIER HANDLE 10FR (MISCELLANEOUS) ×3
SUCTION TUBE FRAZIER 10FR DISP (MISCELLANEOUS) ×1 IMPLANT
SUT FIBERWIRE #2 38 T-5 BLUE (SUTURE) ×6
SUT MNCRL AB 4-0 PS2 18 (SUTURE) ×3 IMPLANT
SUT VIC AB 2-0 CT1 27 (SUTURE) ×3
SUT VIC AB 2-0 CT1 TAPERPNT 27 (SUTURE) ×1 IMPLANT
SUTURE FIBERWR #2 38 T-5 BLUE (SUTURE) ×2 IMPLANT
TOWEL GREEN STERILE (TOWEL DISPOSABLE) ×3 IMPLANT
TOWER SMARTMIX MINI (MISCELLANEOUS) ×1 IMPLANT
WATER STERILE IRR 1000ML POUR (IV SOLUTION) ×3 IMPLANT
YANKAUER SUCT BULB TIP NO VENT (SUCTIONS) ×3 IMPLANT

## 2019-08-02 NOTE — Transfer of Care (Signed)
Immediate Anesthesia Transfer of Care Note  Patient: [name]  Procedure(s) Performed: TOTAL SHOULDER ARTHROPLASTY (Left Shoulder)  Patient Location: PACU  Anesthesia Type:General and Regional  Level of Consciousness: awake and drowsy  Airway & Oxygen Therapy: Patient Spontanous Breathing  Post-op Assessment: Report given to RN and Post -op Vital signs reviewed and stable  Post vital signs: Reviewed and stable  Last Vitals:  Vitals Value Taken Time  BP 120/67 08/02/19 1532  Temp    Pulse 81 08/02/19 1536  Resp 14 08/02/19 1536  SpO2 97 % 08/02/19 1536  Vitals shown include unvalidated device data.  Last Pain:  Vitals:   08/02/19 1155  TempSrc:   PainSc: 0-No pain      Patients Stated Pain Goal: 4 (08/02/19 1113)  Complications: No complications documented.

## 2019-08-02 NOTE — Anesthesia Procedure Notes (Signed)
Anesthesia Regional Block: Interscalene brachial plexus block   Pre-Anesthetic Checklist: ,, timeout performed, Correct Patient, Correct Site, Correct Laterality, Correct Procedure, Correct Position, site marked, Risks and benefits discussed, pre-op evaluation,  At surgeon's request and post-op pain management  Laterality: Left  Prep: Maximum Sterile Barrier Precautions used, chloraprep       Needles:  Injection technique: Single-shot  Needle Type: Echogenic Stimulator Needle     Needle Length: 9cm  Needle Gauge: 22     Additional Needles:   Procedures:,,,, ultrasound used (permanent image in chart),,,,  Narrative:  Start time: 08/02/2019 11:52 AM End time: 08/02/2019 11:54 AM Injection made incrementally with aspirations every 5 mL.  Performed by: Personally  Anesthesiologist: Kaylyn Layer, MD  Additional Notes: Risks, benefits, and alternative discussed. Patient gave consent for procedure. Patient prepped and draped in sterile fashion. Sedation administered, patient remains easily responsive to voice. Relevant anatomy identified with ultrasound guidance. Local anesthetic given in 5cc increments with no signs or symptoms of intravascular injection. No pain or paraesthesias with injection. Patient monitored throughout procedure with signs of LAST or immediate complications. Tolerated well. Ultrasound image placed in chart.  Shelly Greenhouse, MD

## 2019-08-02 NOTE — Op Note (Signed)
Date of Surgery: 08/02/2019  INDICATIONS: [name] is a 64 y.o.-year-old female with a left shoulder osteoarthritis, that is end stage;  The patient did consent to the procedure after discussion of the risks and benefits.  We specifically discussed the risk of bleeding, infection, damage to surrounding neurovascular structures, stiffness, failure of repairs, fracture, dislocation, and the need for further surgery, and she did provide informed consent.  PREOPERATIVE DIAGNOSIS:  Left shoulder end-stage osteoarthritis glenohumeral joint.  POSTOPERATIVE DIAGNOSIS: Same.  PROCEDURE: Left shoulder anatomic replacement  SURGEON: Maryan Rued, M.D.  ASSIST: Darron Doom, RNFA.  ANESTHESIA:  General and regional with interscalene block.  IV FLUIDS AND URINE: See anesthesia.  ESTIMATED BLOOD LOSS: 100 mL.  IMPLANTS:  Arthrex Eclipse stemless arthroplasty size 41 x 18 with a 39 trunnion and a small cage screw Small glenoid polyethylene  DRAINS: None  COMPLICATIONS: None.  DESCRIPTION OF PROCEDURE: After obtaining informed consent,The patient was brought to the operating room and placed supine on the operating table.  The patient had been signed prior to the procedure and this was documented. The patient had the anesthesia placed by the anesthesiologist.  A time-out was performed to confirm that this was the correct patient, site, side and location. The patient did receive antibiotics prior to the incision and was re-dosed during the procedure as needed at indicated intervals.   The patient had the operative extremity prepped and draped in the standard surgical fashion.   The head and neck were nicely stabilized.   A 10 blade was used to make a standard deltopectoral approach to the shoulder.  Dissection was carried out through the subcutaneous tissue to where the deltopectoral interval was visualized and developed.  The cephalic vein was mobilized and retracted laterally for the duration of the  case.  The subacromial space was cleared bluntly retractors were placed appropriately deep to the pectoralis major tendon and deep to the deltoid muscle fascia.  The upper one third of the pectoralis muscle insertion on the humerus was sharply released.  There were no tears noted in the rotator cuff musculature.  Of note when entering the rotator interval the proximal biceps was intact.  There was spontaneous rupture noted just at the level of the upper border of the pectoralis major tendon.  Next I performed a subscapularis takedown and a peel fashion.  Subscapularis, rotator interval, and the inferior capsule were all released.  There were inferior humeral osteophytes that were removed with rongeur and curved osteotome.  After, Releases, a humeral head osteotomy was performed in 30 of retroversion and at 135 head neck angle.  A protractor was placed within the glenoid vault to protect axillary nerve and posterior capsular structures.  Next we prepared the humerus.  Utilizing the sizing guides we elected to use the 39.  With the 41 and 43 which more closely match her humeral head size there was overhang in the anterior to posterior dimension.  The 39 mm had excellent coverage and cortical contact.  We then measured for a small cage screw.  The humeral head retractor and fresh cut protector was placed.  We then turned our attention to the glenoid.  Retractors were placed anteriorly, superiorly, and posteriorly and the labrum was excised.  Exposure of the glenoid was obtained.  The center drill bit was used followed by sequential reaming.  Next using the pegged glenoid guide the holes were drilled appropriately.  The small size glenoid proved to be the appropriate size for this glenoid.  Next the holes were drilled and chiseled appropriately.  Glenoid  was washed with antibiotic irrigation.  Next glenoid vault was dried.  We trialed once again and were satisfied with the small sized the glenoid.  Next,  using antibiotic cementing the superior and inferior holes the glenoid was cemented into place.  Of note, the center ball-like peg was filled with humeral head autograft and not cemented per the manufacturer's recommendation.  Excellent fixation of the glenoid was obtained.   We then turned our attention back to the humeral side.  We trialed to the above final implant.  This had good pushback in the inferior direction as well as posterior direction with 50% bounce back.  We then placed the final implant and impacted this into place.  We used a small cage screw as noted above.  We then prepared for the subscapularis repair.  We again revisited the subscapularis tendon then performed 270 degree releases to ensure adequate excursion.  We then placed 3 Medial Row anchors with the knotless 1.9 mm fiber tack anchors loaded with a single suture tape.  We then passed each of these in tandem 3 consecutive times with a free needle.  This was performed at the medial aspect of the subscapularis tendon.  Next, we placed the arm at about 45 degrees of abduction and neutral rotation.  This was allowed to rest on the Mayo stand.  We then placed a single corner stitch at the lateral aspect of the rotator interval within figure-of-eight suture tape suture to establish the appropriate length of the subscapularis tendon.  Next, we placed our lateral row anchors.  These were both placed in the biceps groove with 1 superior and 1 inferior.  One suture limb from each of the previously placed 3 anchors as well as one limb from the traction stitch placed the subscapularis were loaded into each respective anchor.  There were first drilled and then placed by hand.  He had excellent fixation.  We then placed another figure-of-eight into the lateral aspect of the rotator interval.  This was accomplished with suture tape.  The arm was then ranged and had excellent range of motion with no bony impingement and no unnecessary tension on the  subscapularis.     The wound was once again copious bleed irrigated using antibiotic solution and then normal saline.  We evaluated once again for hemostasis.  The wound was closed in layers with a #2 FiberWire in the deltopectoral interval.  2-0 Vicryl for the deep dermal layer and 3-0 Monocryl in a subcuticular fashion followed by Dermabond glue.  A standard sterile occlusive dressing was applied as well as a postoperative sling.   The patient tolerated the procedure well.  All counts were correct 2.  There were no intraoperative complications.  The patient was transferred to PACU in stable condition.   Disposition: The patient will be nonweightbearing to the operative extremity for proximally 4-6 weeks.  He will begin physical therapy in the outpatient setting.  She will be admitted for routine postoperative care including antibiotics, pain control, and DVT prophylaxis.  Sling will be maintained at all times.  Return for wound check in approximately 2 weeks and then again at 6 weeks.

## 2019-08-02 NOTE — Anesthesia Postprocedure Evaluation (Signed)
Anesthesia Post Note  Patient: [name]  Procedure(s) Performed: TOTAL SHOULDER ARTHROPLASTY (Left Shoulder)     Patient location during evaluation: PACU Anesthesia Type: General Level of consciousness: awake and alert and oriented Pain management: pain level controlled Vital Signs Assessment: post-procedure vital signs reviewed and stable Respiratory status: spontaneous breathing, nonlabored ventilation and respiratory function stable Cardiovascular status: blood pressure returned to baseline Postop Assessment: no apparent nausea or vomiting Anesthetic complications: no   No complications documented.              Kaylyn Layer

## 2019-08-02 NOTE — Brief Op Note (Signed)
08/02/2019  3:15 PM  PATIENT:  Mallie Snooks  64 y.o. female  PRE-OPERATIVE DIAGNOSIS:  Left shoulder osteoarthritis  POST-OPERATIVE DIAGNOSIS:  Left shoulder osteoarthritis  PROCEDURE:  Procedure(s) with comments: TOTAL SHOULDER ARTHROPLASTY (Left) - 2.5 hrs RNFA  SURGEON:  Surgeon(s) and Role:    * Aundria Rud, Noah Delaine, MD - Primary  PHYSICIAN ASSISTANT:   ASSISTANTS: Darron Doom, RNFA   ANESTHESIA:   regional and general  EBL:  100 mL   BLOOD ADMINISTERED:none  DRAINS: none   LOCAL MEDICATIONS USED:  NONE  SPECIMEN:  No Specimen  DISPOSITION OF SPECIMEN:  N/A  COUNTS:  YES  TOURNIQUET:  * No tourniquets in log *  DICTATION: .Note written in EPIC  PLAN OF CARE: Admit for overnight observation  PATIENT DISPOSITION:  PACU - hemodynamically stable.   Delay start of Pharmacological VTE agent (>24hrs) due to surgical blood loss or risk of bleeding: not applicable

## 2019-08-02 NOTE — Anesthesia Procedure Notes (Signed)
Procedure Name: Intubation Date/Time: 08/02/2019 12:51 PM Performed by: Trinna Post., CRNA Pre-anesthesia Checklist: Patient identified, Emergency Drugs available, Suction available, Patient being monitored and Timeout performed Patient Re-evaluated:Patient Re-evaluated prior to induction Oxygen Delivery Method: Circle system utilized Preoxygenation: Pre-oxygenation with 100% oxygen Induction Type: IV induction and Cricoid Pressure applied Ventilation: Mask ventilation without difficulty Laryngoscope Size: Mac and 3 Grade View: Grade I Tube type: Oral Tube size: 7.0 mm Number of attempts: 1 Airway Equipment and Method: Stylet Placement Confirmation: ETT inserted through vocal cords under direct vision,  positive ETCO2 and breath sounds checked- equal and bilateral Secured at: 22 cm Tube secured with: Tape Dental Injury: Teeth and Oropharynx as per pre-operative assessment

## 2019-08-02 NOTE — H&P (Signed)
ORTHOPAEDIC H and P  REQUESTING PHYSICIAN: Yolonda Kida, MD  PCP:  Merri Brunette, MD  Chief Complaint: Left shoulder OA  HPI: [name] is a 64 y.o. female who complains of recalcitrant left shoulder pain related to her end-stage osteoarthritis.  She is here today for left shoulder replacement.  She has had pretty exhaustive conservative care.  We have had lengthy conversations in the office related to our recommendation move ahead with surgery.  No new complaints at this time.  Past Medical History:  Diagnosis Date  . ADHD (attention deficit hyperactivity disorder)   . Anemia   . Anxiety   . Chest pain    Hospital, April, 2013, no MI,  //   nuclear, outpatient, May 07, 2011, ejection fraction 61%, normal  . Fibromyalgia   . Migraine headache   . Osteoporosis    Dx in 06/2019 on bone density test. Referred by Dr. Vincente Poli.   . PONV (postoperative nausea and vomiting)   . Vitamin D insufficiency    Past Surgical History:  Procedure Laterality Date  . ABDOMINAL HYSTERECTOMY  2008  . APPENDECTOMY  1990's  . BREAST BIOPSY Left   . FINGER SURGERY Left 2011  . REDUCTION MAMMAPLASTY  1990  . TONSILLECTOMY AND ADENOIDECTOMY  1976   Social History   Socioeconomic History  . Marital status: Married    Spouse name: Not on file  . Number of children: 2  . Years of education: Not on file  . Highest education level: Not on file  Occupational History  . Occupation: Runner, broadcasting/film/video  Tobacco Use  . Smoking status: Never Smoker  . Smokeless tobacco: Never Used  Vaping Use  . Vaping Use: Never used  Substance and Sexual Activity  . Alcohol use: No  . Drug use: No  . Sexual activity: Yes  Other Topics Concern  . Not on file  Social History Narrative   Lives in [place], [place] with husband.    Social Determinants of Health   Financial Resource Strain:   . Difficulty of Paying Living Expenses:   Food Insecurity:   . Worried About Programme researcher, broadcasting/film/video in the Last Year:    . Barista in the Last Year:   Transportation Needs:   . Freight forwarder (Medical):   Marland Kitchen Lack of Transportation (Non-Medical):   Physical Activity:   . Days of Exercise per Week:   . Minutes of Exercise per Session:   Stress:   . Feeling of Stress :   Social Connections:   . Frequency of Communication with Friends and Family:   . Frequency of Social Gatherings with Friends and Family:   . Attends Religious Services:   . Active Member of Clubs or Organizations:   . Attends Banker Meetings:   Marland Kitchen Marital Status:    Family History  Problem Relation Age of Onset  . Heart attack Father 66  . Heart attack Paternal Grandfather 16  . Arrhythmia Mother        Atrial fibrillation  . Breast cancer Neg Hx    Allergies  Allergen Reactions  . Codeine Other (See Comments)    hallucinations   Prior to Admission medications   Medication Sig Start Date End Date Taking? Authorizing Provider  amphetamine-dextroamphetamine (ADDERALL XR) 25 MG 24 hr capsule Take 25 mg by mouth every morning.   Yes [provider]  Cholecalciferol (VITAMIN D) 50 MCG (2000 UT) tablet Take 2,000 Units by mouth daily.  Yes [provider]  Multiple Vitamin (MULTIVITAMIN WITH MINERALS) TABS tablet Take 1 tablet by mouth daily.   Yes [provider]  zolpidem (AMBIEN) 10 MG tablet Take 5 mg by mouth at bedtime as needed for sleep. 04/07/19  Yes [provider]  ALPRAZolam Prudy Feeler) 0.5 MG tablet Take 0.5 tablets (0.25 mg total) by mouth 2 (two) times daily as needed for anxiety. No more than 1 tablet total in 24 hr time frame/ only use as needed Patient taking differently: Take 0.25 mg by mouth 2 (two) times daily as needed (vertigo). No more than 1 tablet total in 24 hr time frame/ only use as needed 11/25/18   Dohmeier, Porfirio Mylar, MD  ibuprofen (ADVIL) 200 MG tablet Take 400 mg by mouth every 6 (six) hours as needed for moderate pain.    [provider]    No results found.  Positive ROS: All other systems have been reviewed and were otherwise negative with the exception of those mentioned in the HPI and as above.  Physical Exam: General: Alert, no acute distress Cardiovascular: No pedal edema Respiratory: No cyanosis, no use of accessory musculature GI: No organomegaly, abdomen is soft and non-tender Skin: No lesions in the area of chief complaint Neurologic: Sensation intact distally Psychiatric: Patient is competent for consent with normal mood and affect Lymphatic: No axillary or cervical lymphadenopathy  MUSCULOSKELETAL:  Left upper extremity:  No obvious wounds.  Skin is warm and well-perfused.  Assessment: 1.  Left shoulder end-stage osteoarthritis  Plan: -Our plan will be to move ahead with a scheduled total shoulder arthroplasty on the left.  We again reviewed this procedure in detail.  All questions were solicited and answered to her satisfaction.  -We did discuss the risk of bleeding, infection, fracture, dislocation, failure of rotator cuff, need for revision surgery, DVT, and the risk of anesthesia.  She has provided informed consent.  -She will be admitted postoperatively for overnight stay.  Discharge home tomorrow afternoon.    Yolonda Kida, MD Cell [phone]    08/02/2019 12:21 PM

## 2019-08-02 NOTE — Progress Notes (Signed)
Orthopedic Tech Progress Note Patient Details:  [name] 09-18-1955 [id number] PACU RN called requesting for a ARM SLING with the 2 straps. Ortho Devices Type of Ortho Device: Shoulder immobilizer Ortho Device/Splint Location: LUE Ortho Device/Splint Interventions: Ordered, Application   Post Interventions Patient Tolerated: Well Instructions Provided: Care of device   Donald Pore 08/02/2019, 5:05 PM

## 2019-08-03 DIAGNOSIS — M810 Age-related osteoporosis without current pathological fracture: Secondary | ICD-10-CM | POA: Diagnosis not present

## 2019-08-03 DIAGNOSIS — F909 Attention-deficit hyperactivity disorder, unspecified type: Secondary | ICD-10-CM | POA: Diagnosis not present

## 2019-08-03 DIAGNOSIS — M25712 Osteophyte, left shoulder: Secondary | ICD-10-CM | POA: Diagnosis not present

## 2019-08-03 DIAGNOSIS — M19012 Primary osteoarthritis, left shoulder: Secondary | ICD-10-CM | POA: Diagnosis not present

## 2019-08-03 DIAGNOSIS — Z885 Allergy status to narcotic agent status: Secondary | ICD-10-CM | POA: Diagnosis not present

## 2019-08-03 DIAGNOSIS — Z79899 Other long term (current) drug therapy: Secondary | ICD-10-CM | POA: Diagnosis not present

## 2019-08-03 DIAGNOSIS — F419 Anxiety disorder, unspecified: Secondary | ICD-10-CM | POA: Diagnosis not present

## 2019-08-03 LAB — HEMOGLOBIN AND HEMATOCRIT, BLOOD
HCT: 35.6 % — ABNORMAL LOW (ref 36.0–46.0)
Hemoglobin: 11.5 g/dL — ABNORMAL LOW (ref 12.0–15.0)

## 2019-08-03 MED ORDER — ONDANSETRON 4 MG PO TBDP: 4.0000 mg | ORAL_TABLET | Freq: Three times a day (TID) | ORAL | 0 refills | Status: AC | PRN

## 2019-08-03 MED ORDER — METHOCARBAMOL 500 MG PO TABS: 500.0000 mg | ORAL_TABLET | Freq: Four times a day (QID) | ORAL | 0 refills | Status: AC | PRN

## 2019-08-03 MED ORDER — OXYCODONE HCL 5 MG PO TABS: 5.0000 mg | ORAL_TABLET | ORAL | 0 refills | Status: AC | PRN

## 2019-08-03 NOTE — Plan of Care (Signed)
Patient alert and oriented, mae's well, voiding adequate amount of urine, swallowing without difficulty, no c/o pain at time of discharge. Patient discharged home with family. Script and discharged instructions given to patient. Patient and family stated understanding of instructions given. Patient has an appointment with Dr. Rogers   

## 2019-08-03 NOTE — Progress Notes (Signed)
   Subjective:  Patient reports pain as mild.  Block wearing off some.  No n/v/sob/cp  Objective:   VITALS:   Vitals:   08/03/19 0434 08/03/19 0725 08/03/19 1225 08/03/19 1620  BP: 108/62 (!) 98/51 (!) 104/50   Pulse: (!) 59 64 70   Resp: 18 16 16    Temp: 98.4 F (36.9 C) 98.3 F (36.8 C) 98.6 F (37 C) 98.6 F (37 C)  TempSrc: Oral Oral Oral Oral  SpO2: 97% 96% 100%   Weight:      Height:        Neurologically intact Sensation intact distally Incision: dressing C/D/I No cellulitis present Compartment soft - decreased sensation proximal to elbow 2/2 to PNB  Lab Results  Component Value Date   WBC 6.4 08/01/2019   HGB 11.5 (L) 08/03/2019   HCT 35.6 (L) 08/03/2019   MCV 98.3 08/01/2019   PLT 226 08/01/2019   BMET    Component Value Date/Time   NA 140 04/24/2011 0151   K 4.1 04/24/2011 0151   CL 106 04/24/2011 0151   CO2 28 04/24/2011 0151   GLUCOSE 127 (H) 04/24/2011 0151   BUN 19 04/24/2011 0151   CREATININE 0.80 04/24/2011 0151   CALCIUM 9.0 04/24/2011 0151   GFRNONAA 81 (L) 04/24/2011 0151   GFRAA >90 04/24/2011 0151     Assessment/Plan: 1 Day Post-Op   Active Problems:   Osteoarthritis of left shoulder   S/p reverse total shoulder arthroplasty   Up with therapy - Sling at all times, other than exercise - maintain bandage until follow up  -- dc home today - follow up 2 weeks   06/24/2011 08/03/2019, 4:21 PM   08/05/2019, MD [phone]

## 2019-08-03 NOTE — Evaluation (Signed)
Occupational Therapy Evaluation and Discharge Patient Details Name: [name] MRN: [id number] DOB: Mar 14, 1955 Today's Date: 08/03/2019    History of Present Illness Pt is a 64 year old woman admitted for L TSA due to end stage OA. PMH: osteoporosis, fibromyalgia, HA, anxiety.   Clinical Impression   All education completed and reinforced with written handouts. Pt verbalizing understanding.     Follow Up Recommendations  Follow surgeon's recommendation for DC plan and follow-up therapies    Equipment Recommendations  None recommended by OT    Recommendations for Other Services       Precautions / Restrictions Precautions Precautions: Shoulder Type of Shoulder Precautions: no shoulder movement, elbow to hand AROM Shoulder Interventions: Shoulder sling/immobilizer;Off for dressing/bathing/exercises Precaution Booklet Issued: Yes (comment) Required Braces or Orthoses: Sling Restrictions Weight Bearing Restrictions: Yes LUE Weight Bearing: Non weight bearing      Mobility Bed Mobility Overal bed mobility: Modified Independent                Transfers                      Balance                                           ADL either performed or assessed with clinical judgement   ADL Overall ADL's : Modified independent                                       General ADL Comments: Educated pt in compensatory strategies for ADL, NWB precaution, positioning arm in sling and use of pillows to support L UE in bed and chair     Vision Baseline Vision/History: Wears glasses Wears Glasses: At all times Patient Visual Report: No change from baseline       Perception     Praxis      Pertinent Vitals/Pain Pain Assessment: No/denies pain (nerve block intact)     Hand Dominance Right   Extremity/Trunk Assessment Upper Extremity Assessment Upper Extremity Assessment: LUE deficits/detail LUE Deficits / Details:  educated in AROM elbow>hand, no shoulder movement LUE Coordination: decreased fine motor;decreased gross motor (nerve block intact)   Lower Extremity Assessment Lower Extremity Assessment: Overall WFL for tasks assessed   Cervical / Trunk Assessment Cervical / Trunk Assessment: Normal   Communication Communication Communication: No difficulties   Cognition Arousal/Alertness: Awake/alert Behavior During Therapy: WFL for tasks assessed/performed Overall Cognitive Status: Within Functional Limits for tasks assessed                                     General Comments       Exercises     Shoulder Instructions      Home Living Family/patient expects to be discharged to:: Private residence Living Arrangements: Spouse/significant other Available Help at Discharge: Family;Available PRN/intermittently                                    Prior Functioning/Environment Level of Independence: Independent                 OT Problem List:  OT Treatment/Interventions:      OT Goals(Current goals can be found in the care plan section) Acute Rehab OT Goals Patient Stated Goal: regain use of L UE  OT Frequency:     Barriers to D/C:            Co-evaluation              AM-PAC OT "6 Clicks" Daily Activity     Outcome Measure Help from another person eating meals?: None Help from another person taking care of personal grooming?: None Help from another person toileting, which includes using toliet, bedpan, or urinal?: None Help from another person bathing (including washing, rinsing, drying)?: None Help from another person to put on and taking off regular upper body clothing?: None Help from another person to put on and taking off regular lower body clothing?: None 6 Click Score: 24   End of Session    Activity Tolerance: Patient tolerated treatment well Patient left: in bed;with call bell/phone within reach;with family/visitor  present  OT Visit Diagnosis: Muscle weakness (generalized) (M62.81)                Time: 9030-0923 OT Time Calculation (min): 26 min Charges:  OT General Charges $OT Visit: 1 Visit OT Evaluation $OT Eval Low Complexity: 1 Low OT Treatments $Self Care/Home Management : 8-22 mins  Martie Round, OTR/L Acute Rehabilitation Services Pager: [phone] Office: [phone]  Evern Bio 08/03/2019, 10:10 AM

## 2019-08-03 NOTE — Discharge Instructions (Signed)
-  Maintain postoperative bandage until your follow-up appointment in 2 weeks.  You may shower with this in place, but do not submerge underwater.  -Maintain your arm in the sling at all times unless doing pendulums and/or hand, wrist, and elbow range of motion as tolerated.  No lifting with the left arm.  -Apply ice to the left shoulder for 20 to 30 minutes out of each hour that you are awake.  -For mild to moderate pain use Tylenol (every 6 hours) and Advil (every 6 hours) around-the-clock.  For breakthrough pain use oxycodone as necessary.  For cramping/muscle spasms use Robaxin as needed  -Return to see Dr. Aundria Rud in the office in 2 weeks for routine postoperative check.

## 2019-08-04 ENCOUNTER — Encounter (HOSPITAL_COMMUNITY): Payer: Self-pay | Admitting: Orthopedic Surgery

## 2019-08-17 DIAGNOSIS — M25512 Pain in left shoulder: Secondary | ICD-10-CM | POA: Diagnosis not present

## 2019-08-17 DIAGNOSIS — M25612 Stiffness of left shoulder, not elsewhere classified: Secondary | ICD-10-CM | POA: Diagnosis not present

## 2019-08-19 DIAGNOSIS — M25619 Stiffness of unspecified shoulder, not elsewhere classified: Secondary | ICD-10-CM | POA: Diagnosis not present

## 2019-08-19 DIAGNOSIS — M25512 Pain in left shoulder: Secondary | ICD-10-CM | POA: Diagnosis not present

## 2019-08-22 DIAGNOSIS — M810 Age-related osteoporosis without current pathological fracture: Secondary | ICD-10-CM | POA: Diagnosis not present

## 2019-08-29 DIAGNOSIS — M25512 Pain in left shoulder: Secondary | ICD-10-CM | POA: Diagnosis not present

## 2019-08-29 DIAGNOSIS — M25619 Stiffness of unspecified shoulder, not elsewhere classified: Secondary | ICD-10-CM | POA: Diagnosis not present

## 2019-09-06 DIAGNOSIS — M25512 Pain in left shoulder: Secondary | ICD-10-CM | POA: Diagnosis not present

## 2019-09-06 DIAGNOSIS — M25619 Stiffness of unspecified shoulder, not elsewhere classified: Secondary | ICD-10-CM | POA: Diagnosis not present

## 2019-09-08 DIAGNOSIS — M25512 Pain in left shoulder: Secondary | ICD-10-CM | POA: Diagnosis not present

## 2019-09-08 DIAGNOSIS — M25619 Stiffness of unspecified shoulder, not elsewhere classified: Secondary | ICD-10-CM | POA: Diagnosis not present

## 2019-09-12 DIAGNOSIS — M25512 Pain in left shoulder: Secondary | ICD-10-CM | POA: Diagnosis not present

## 2019-09-12 DIAGNOSIS — M25612 Stiffness of left shoulder, not elsewhere classified: Secondary | ICD-10-CM | POA: Diagnosis not present

## 2019-09-14 DIAGNOSIS — Z4789 Encounter for other orthopedic aftercare: Secondary | ICD-10-CM | POA: Diagnosis not present

## 2019-09-20 DIAGNOSIS — Z96612 Presence of left artificial shoulder joint: Secondary | ICD-10-CM | POA: Diagnosis not present

## 2019-09-20 DIAGNOSIS — M25512 Pain in left shoulder: Secondary | ICD-10-CM | POA: Diagnosis not present

## 2019-09-20 DIAGNOSIS — I890 Lymphedema, not elsewhere classified: Secondary | ICD-10-CM | POA: Diagnosis not present

## 2019-09-20 DIAGNOSIS — M19012 Primary osteoarthritis, left shoulder: Secondary | ICD-10-CM | POA: Diagnosis not present

## 2019-09-20 DIAGNOSIS — Z471 Aftercare following joint replacement surgery: Secondary | ICD-10-CM | POA: Diagnosis not present

## 2019-09-21 DIAGNOSIS — M25612 Stiffness of left shoulder, not elsewhere classified: Secondary | ICD-10-CM | POA: Diagnosis not present

## 2019-09-21 DIAGNOSIS — M25512 Pain in left shoulder: Secondary | ICD-10-CM | POA: Diagnosis not present

## 2019-09-23 DIAGNOSIS — M25512 Pain in left shoulder: Secondary | ICD-10-CM | POA: Diagnosis not present

## 2019-09-23 DIAGNOSIS — M25612 Stiffness of left shoulder, not elsewhere classified: Secondary | ICD-10-CM | POA: Diagnosis not present

## 2019-09-26 DIAGNOSIS — M25512 Pain in left shoulder: Secondary | ICD-10-CM | POA: Diagnosis not present

## 2019-09-26 DIAGNOSIS — M25612 Stiffness of left shoulder, not elsewhere classified: Secondary | ICD-10-CM | POA: Diagnosis not present

## 2019-09-28 DIAGNOSIS — M25612 Stiffness of left shoulder, not elsewhere classified: Secondary | ICD-10-CM | POA: Diagnosis not present

## 2019-09-28 DIAGNOSIS — M25512 Pain in left shoulder: Secondary | ICD-10-CM | POA: Diagnosis not present

## 2019-10-05 DIAGNOSIS — M25612 Stiffness of left shoulder, not elsewhere classified: Secondary | ICD-10-CM | POA: Diagnosis not present

## 2019-10-05 DIAGNOSIS — M25512 Pain in left shoulder: Secondary | ICD-10-CM | POA: Diagnosis not present

## 2019-10-10 DIAGNOSIS — I890 Lymphedema, not elsewhere classified: Secondary | ICD-10-CM | POA: Diagnosis not present

## 2019-10-10 DIAGNOSIS — Z471 Aftercare following joint replacement surgery: Secondary | ICD-10-CM | POA: Diagnosis not present

## 2019-10-10 DIAGNOSIS — M25512 Pain in left shoulder: Secondary | ICD-10-CM | POA: Diagnosis not present

## 2019-10-10 DIAGNOSIS — M19012 Primary osteoarthritis, left shoulder: Secondary | ICD-10-CM | POA: Diagnosis not present

## 2019-10-10 DIAGNOSIS — Z96612 Presence of left artificial shoulder joint: Secondary | ICD-10-CM | POA: Diagnosis not present

## 2019-10-12 DIAGNOSIS — M25612 Stiffness of left shoulder, not elsewhere classified: Secondary | ICD-10-CM | POA: Diagnosis not present

## 2019-10-12 DIAGNOSIS — M25512 Pain in left shoulder: Secondary | ICD-10-CM | POA: Diagnosis not present

## 2019-10-17 DIAGNOSIS — M25612 Stiffness of left shoulder, not elsewhere classified: Secondary | ICD-10-CM | POA: Diagnosis not present

## 2019-10-17 DIAGNOSIS — M25512 Pain in left shoulder: Secondary | ICD-10-CM | POA: Diagnosis not present

## 2019-10-25 DIAGNOSIS — Z96612 Presence of left artificial shoulder joint: Secondary | ICD-10-CM | POA: Diagnosis not present

## 2019-10-25 DIAGNOSIS — Z471 Aftercare following joint replacement surgery: Secondary | ICD-10-CM | POA: Diagnosis not present

## 2019-10-25 DIAGNOSIS — M25512 Pain in left shoulder: Secondary | ICD-10-CM | POA: Diagnosis not present

## 2019-10-25 DIAGNOSIS — M19012 Primary osteoarthritis, left shoulder: Secondary | ICD-10-CM | POA: Diagnosis not present

## 2019-10-25 DIAGNOSIS — I890 Lymphedema, not elsewhere classified: Secondary | ICD-10-CM | POA: Diagnosis not present

## 2019-10-26 DIAGNOSIS — M25512 Pain in left shoulder: Secondary | ICD-10-CM | POA: Diagnosis not present

## 2019-10-26 DIAGNOSIS — M25612 Stiffness of left shoulder, not elsewhere classified: Secondary | ICD-10-CM | POA: Diagnosis not present

## 2019-10-28 DIAGNOSIS — M25612 Stiffness of left shoulder, not elsewhere classified: Secondary | ICD-10-CM | POA: Diagnosis not present

## 2019-10-28 DIAGNOSIS — M25512 Pain in left shoulder: Secondary | ICD-10-CM | POA: Diagnosis not present

## 2019-11-07 DIAGNOSIS — M25612 Stiffness of left shoulder, not elsewhere classified: Secondary | ICD-10-CM | POA: Diagnosis not present

## 2019-11-07 DIAGNOSIS — M25512 Pain in left shoulder: Secondary | ICD-10-CM | POA: Diagnosis not present

## 2019-11-09 DIAGNOSIS — M25512 Pain in left shoulder: Secondary | ICD-10-CM | POA: Diagnosis not present

## 2019-11-09 DIAGNOSIS — M25612 Stiffness of left shoulder, not elsewhere classified: Secondary | ICD-10-CM | POA: Diagnosis not present

## 2019-11-14 DIAGNOSIS — M25612 Stiffness of left shoulder, not elsewhere classified: Secondary | ICD-10-CM | POA: Diagnosis not present

## 2019-11-14 DIAGNOSIS — M25512 Pain in left shoulder: Secondary | ICD-10-CM | POA: Diagnosis not present

## 2019-11-21 DIAGNOSIS — M25512 Pain in left shoulder: Secondary | ICD-10-CM | POA: Diagnosis not present

## 2019-11-21 DIAGNOSIS — M25612 Stiffness of left shoulder, not elsewhere classified: Secondary | ICD-10-CM | POA: Diagnosis not present

## 2019-11-23 DIAGNOSIS — M25612 Stiffness of left shoulder, not elsewhere classified: Secondary | ICD-10-CM | POA: Diagnosis not present

## 2019-11-23 DIAGNOSIS — M25512 Pain in left shoulder: Secondary | ICD-10-CM | POA: Diagnosis not present

## 2019-11-28 DIAGNOSIS — M25512 Pain in left shoulder: Secondary | ICD-10-CM | POA: Diagnosis not present

## 2019-11-28 DIAGNOSIS — M25612 Stiffness of left shoulder, not elsewhere classified: Secondary | ICD-10-CM | POA: Diagnosis not present

## 2019-11-30 DIAGNOSIS — M25612 Stiffness of left shoulder, not elsewhere classified: Secondary | ICD-10-CM | POA: Diagnosis not present

## 2019-11-30 DIAGNOSIS — M25512 Pain in left shoulder: Secondary | ICD-10-CM | POA: Diagnosis not present

## 2019-12-12 DIAGNOSIS — M25512 Pain in left shoulder: Secondary | ICD-10-CM | POA: Diagnosis not present

## 2019-12-12 DIAGNOSIS — M25612 Stiffness of left shoulder, not elsewhere classified: Secondary | ICD-10-CM | POA: Diagnosis not present

## 2019-12-21 DIAGNOSIS — M25512 Pain in left shoulder: Secondary | ICD-10-CM | POA: Diagnosis not present

## 2019-12-26 DIAGNOSIS — M25512 Pain in left shoulder: Secondary | ICD-10-CM | POA: Diagnosis not present

## 2019-12-26 DIAGNOSIS — M25612 Stiffness of left shoulder, not elsewhere classified: Secondary | ICD-10-CM | POA: Diagnosis not present

## 2019-12-28 DIAGNOSIS — M25512 Pain in left shoulder: Secondary | ICD-10-CM | POA: Diagnosis not present

## 2019-12-28 DIAGNOSIS — M25612 Stiffness of left shoulder, not elsewhere classified: Secondary | ICD-10-CM | POA: Diagnosis not present

## 2020-01-02 DIAGNOSIS — M25512 Pain in left shoulder: Secondary | ICD-10-CM | POA: Diagnosis not present

## 2020-01-02 DIAGNOSIS — M25612 Stiffness of left shoulder, not elsewhere classified: Secondary | ICD-10-CM | POA: Diagnosis not present

## 2020-01-04 DIAGNOSIS — M25512 Pain in left shoulder: Secondary | ICD-10-CM | POA: Diagnosis not present

## 2020-01-04 DIAGNOSIS — M25612 Stiffness of left shoulder, not elsewhere classified: Secondary | ICD-10-CM | POA: Diagnosis not present

## 2020-02-04 DIAGNOSIS — Z20822 Contact with and (suspected) exposure to covid-19: Secondary | ICD-10-CM | POA: Diagnosis not present

## 2020-02-04 DIAGNOSIS — J209 Acute bronchitis, unspecified: Secondary | ICD-10-CM | POA: Diagnosis not present

## 2020-03-21 DIAGNOSIS — R21 Rash and other nonspecific skin eruption: Secondary | ICD-10-CM | POA: Diagnosis not present

## 2020-04-13 ENCOUNTER — Other Ambulatory Visit: Payer: Self-pay | Admitting: Obstetrics and Gynecology

## 2020-04-13 DIAGNOSIS — Z1231 Encounter for screening mammogram for malignant neoplasm of breast: Secondary | ICD-10-CM

## 2020-06-06 ENCOUNTER — Ambulatory Visit
Admission: RE | Admit: 2020-06-06 | Discharge: 2020-06-06 | Disposition: A | Discharge status: Home / Self Care | Payer: BC Managed Care – PPO | Source: Ambulatory Visit | Attending: Obstetrics and Gynecology | Admitting: Obstetrics and Gynecology

## 2020-06-06 ENCOUNTER — Other Ambulatory Visit: Payer: Self-pay

## 2020-06-06 DIAGNOSIS — Z1231 Encounter for screening mammogram for malignant neoplasm of breast: Secondary | ICD-10-CM | POA: Diagnosis not present

## 2020-08-02 DIAGNOSIS — N390 Urinary tract infection, site not specified: Secondary | ICD-10-CM | POA: Diagnosis not present

## 2020-08-02 DIAGNOSIS — M810 Age-related osteoporosis without current pathological fracture: Secondary | ICD-10-CM | POA: Diagnosis not present

## 2020-08-02 DIAGNOSIS — Z01419 Encounter for gynecological examination (general) (routine) without abnormal findings: Secondary | ICD-10-CM | POA: Diagnosis not present

## 2020-08-02 DIAGNOSIS — Z6824 Body mass index (BMI) 24.0-24.9, adult: Secondary | ICD-10-CM | POA: Diagnosis not present

## 2020-08-04 DIAGNOSIS — R509 Fever, unspecified: Secondary | ICD-10-CM | POA: Diagnosis not present

## 2020-08-04 DIAGNOSIS — J209 Acute bronchitis, unspecified: Secondary | ICD-10-CM | POA: Diagnosis not present

## 2020-08-23 DIAGNOSIS — Z13820 Encounter for screening for osteoporosis: Secondary | ICD-10-CM | POA: Diagnosis not present

## 2021-02-13 DIAGNOSIS — H2512 Age-related nuclear cataract, left eye: Secondary | ICD-10-CM | POA: Diagnosis not present

## 2021-02-13 DIAGNOSIS — H04123 Dry eye syndrome of bilateral lacrimal glands: Secondary | ICD-10-CM | POA: Diagnosis not present

## 2021-07-29 ENCOUNTER — Other Ambulatory Visit: Payer: Self-pay | Admitting: Obstetrics and Gynecology

## 2021-07-29 DIAGNOSIS — Z1231 Encounter for screening mammogram for malignant neoplasm of breast: Secondary | ICD-10-CM

## 2021-08-07 ENCOUNTER — Ambulatory Visit
Admission: RE | Admit: 2021-08-07 | Discharge: 2021-08-07 | Disposition: A | Discharge status: Home / Self Care | Payer: BC Managed Care – PPO | Source: Ambulatory Visit | Attending: Obstetrics and Gynecology | Admitting: Obstetrics and Gynecology

## 2021-08-07 DIAGNOSIS — Z1231 Encounter for screening mammogram for malignant neoplasm of breast: Secondary | ICD-10-CM

## 2021-09-18 DIAGNOSIS — Z01419 Encounter for gynecological examination (general) (routine) without abnormal findings: Secondary | ICD-10-CM | POA: Diagnosis not present

## 2021-09-18 DIAGNOSIS — Z6823 Body mass index (BMI) 23.0-23.9, adult: Secondary | ICD-10-CM | POA: Diagnosis not present

## 2021-09-18 DIAGNOSIS — N390 Urinary tract infection, site not specified: Secondary | ICD-10-CM | POA: Diagnosis not present

## 2021-10-03 DIAGNOSIS — Z1211 Encounter for screening for malignant neoplasm of colon: Secondary | ICD-10-CM | POA: Diagnosis not present

## 2021-10-09 LAB — COLOGUARD: COLOGUARD: NEGATIVE

## 2021-10-09 LAB — EXTERNAL GENERIC LAB PROCEDURE: COLOGUARD: NEGATIVE

## 2022-01-14 DIAGNOSIS — F900 Attention-deficit hyperactivity disorder, predominantly inattentive type: Secondary | ICD-10-CM | POA: Diagnosis not present

## 2022-01-14 DIAGNOSIS — R030 Elevated blood-pressure reading, without diagnosis of hypertension: Secondary | ICD-10-CM | POA: Diagnosis not present

## 2022-01-14 DIAGNOSIS — R413 Other amnesia: Secondary | ICD-10-CM | POA: Diagnosis not present

## 2022-01-14 DIAGNOSIS — G43909 Migraine, unspecified, not intractable, without status migrainosus: Secondary | ICD-10-CM | POA: Diagnosis not present

## 2022-01-14 DIAGNOSIS — E7800 Pure hypercholesterolemia, unspecified: Secondary | ICD-10-CM | POA: Diagnosis not present

## 2022-02-11 ENCOUNTER — Other Ambulatory Visit: Payer: Self-pay | Admitting: Family Medicine

## 2022-02-11 DIAGNOSIS — R413 Other amnesia: Secondary | ICD-10-CM | POA: Diagnosis not present

## 2022-02-11 DIAGNOSIS — U099 Post covid-19 condition, unspecified: Secondary | ICD-10-CM | POA: Diagnosis not present

## 2022-02-11 DIAGNOSIS — F5101 Primary insomnia: Secondary | ICD-10-CM | POA: Diagnosis not present

## 2022-02-11 DIAGNOSIS — R030 Elevated blood-pressure reading, without diagnosis of hypertension: Secondary | ICD-10-CM | POA: Diagnosis not present

## 2022-02-28 ENCOUNTER — Encounter: Payer: Self-pay | Admitting: Family Medicine

## 2022-03-03 ENCOUNTER — Other Ambulatory Visit: Payer: BC Managed Care – PPO

## 2022-03-03 ENCOUNTER — Ambulatory Visit
Admission: RE | Admit: 2022-03-03 | Discharge: 2022-03-03 | Disposition: A | Discharge status: Home / Self Care | Payer: BC Managed Care – PPO | Source: Ambulatory Visit | Attending: Family Medicine | Admitting: Family Medicine

## 2022-03-03 DIAGNOSIS — R413 Other amnesia: Secondary | ICD-10-CM | POA: Diagnosis not present

## 2022-03-03 DIAGNOSIS — U071 COVID-19: Secondary | ICD-10-CM | POA: Diagnosis not present

## 2022-03-12 DIAGNOSIS — H04123 Dry eye syndrome of bilateral lacrimal glands: Secondary | ICD-10-CM | POA: Diagnosis not present

## 2022-03-12 DIAGNOSIS — H532 Diplopia: Secondary | ICD-10-CM | POA: Diagnosis not present

## 2022-03-12 DIAGNOSIS — H2512 Age-related nuclear cataract, left eye: Secondary | ICD-10-CM | POA: Diagnosis not present

## 2022-03-12 DIAGNOSIS — H26491 Other secondary cataract, right eye: Secondary | ICD-10-CM | POA: Diagnosis not present

## 2022-04-02 DIAGNOSIS — R4189 Other symptoms and signs involving cognitive functions and awareness: Secondary | ICD-10-CM | POA: Diagnosis not present

## 2022-04-02 DIAGNOSIS — R413 Other amnesia: Secondary | ICD-10-CM | POA: Diagnosis not present

## 2022-04-02 DIAGNOSIS — G9332 Myalgic encephalomyelitis/chronic fatigue syndrome: Secondary | ICD-10-CM | POA: Diagnosis not present

## 2022-04-02 DIAGNOSIS — U099 Post covid-19 condition, unspecified: Secondary | ICD-10-CM | POA: Diagnosis not present

## 2022-06-19 DIAGNOSIS — M25532 Pain in left wrist: Secondary | ICD-10-CM | POA: Diagnosis not present

## 2022-06-19 DIAGNOSIS — S63502A Unspecified sprain of left wrist, initial encounter: Secondary | ICD-10-CM | POA: Diagnosis not present

## 2022-09-19 ENCOUNTER — Other Ambulatory Visit: Payer: Self-pay | Admitting: Family Medicine

## 2022-09-19 DIAGNOSIS — Z1231 Encounter for screening mammogram for malignant neoplasm of breast: Secondary | ICD-10-CM

## 2022-09-23 ENCOUNTER — Other Ambulatory Visit: Payer: Self-pay | Admitting: Family Medicine

## 2022-09-23 DIAGNOSIS — Z1231 Encounter for screening mammogram for malignant neoplasm of breast: Secondary | ICD-10-CM

## 2022-09-26 DIAGNOSIS — M25561 Pain in right knee: Secondary | ICD-10-CM | POA: Diagnosis not present

## 2022-10-01 DIAGNOSIS — Z01419 Encounter for gynecological examination (general) (routine) without abnormal findings: Secondary | ICD-10-CM | POA: Diagnosis not present

## 2022-10-01 DIAGNOSIS — Z13820 Encounter for screening for osteoporosis: Secondary | ICD-10-CM | POA: Diagnosis not present

## 2022-10-01 DIAGNOSIS — M25561 Pain in right knee: Secondary | ICD-10-CM | POA: Diagnosis not present

## 2022-10-01 DIAGNOSIS — Z6824 Body mass index (BMI) 24.0-24.9, adult: Secondary | ICD-10-CM | POA: Diagnosis not present

## 2022-10-02 ENCOUNTER — Ambulatory Visit: Payer: BC Managed Care – PPO

## 2022-10-07 DIAGNOSIS — M25561 Pain in right knee: Secondary | ICD-10-CM | POA: Diagnosis not present

## 2022-10-08 ENCOUNTER — Ambulatory Visit
Admission: RE | Admit: 2022-10-08 | Discharge: 2022-10-08 | Disposition: A | Discharge status: Home / Self Care | Payer: BC Managed Care – PPO | Source: Ambulatory Visit

## 2022-10-08 DIAGNOSIS — Z130 Encounter for screening for diseases of the blood and blood-forming organs and certain disorders involving the immune mechanism: Secondary | ICD-10-CM | POA: Diagnosis not present

## 2022-10-08 DIAGNOSIS — Z1231 Encounter for screening mammogram for malignant neoplasm of breast: Secondary | ICD-10-CM

## 2022-10-08 DIAGNOSIS — Z13220 Encounter for screening for lipoid disorders: Secondary | ICD-10-CM | POA: Diagnosis not present

## 2022-10-08 DIAGNOSIS — Z131 Encounter for screening for diabetes mellitus: Secondary | ICD-10-CM | POA: Diagnosis not present

## 2022-10-08 DIAGNOSIS — Z1329 Encounter for screening for other suspected endocrine disorder: Secondary | ICD-10-CM | POA: Diagnosis not present

## 2022-10-08 DIAGNOSIS — Z13228 Encounter for screening for other metabolic disorders: Secondary | ICD-10-CM | POA: Diagnosis not present

## 2022-10-15 DIAGNOSIS — M25561 Pain in right knee: Secondary | ICD-10-CM | POA: Diagnosis not present

## 2022-10-21 DIAGNOSIS — Z20822 Contact with and (suspected) exposure to covid-19: Secondary | ICD-10-CM | POA: Diagnosis not present

## 2022-10-21 DIAGNOSIS — J069 Acute upper respiratory infection, unspecified: Secondary | ICD-10-CM | POA: Diagnosis not present

## 2022-11-24 DIAGNOSIS — F900 Attention-deficit hyperactivity disorder, predominantly inattentive type: Secondary | ICD-10-CM | POA: Diagnosis not present

## 2023-07-01 DIAGNOSIS — N951 Menopausal and female climacteric states: Secondary | ICD-10-CM | POA: Diagnosis not present

## 2023-08-05 DIAGNOSIS — H26491 Other secondary cataract, right eye: Secondary | ICD-10-CM | POA: Diagnosis not present

## 2023-08-05 DIAGNOSIS — H2512 Age-related nuclear cataract, left eye: Secondary | ICD-10-CM | POA: Diagnosis not present

## 2023-08-05 DIAGNOSIS — H04123 Dry eye syndrome of bilateral lacrimal glands: Secondary | ICD-10-CM | POA: Diagnosis not present

## 2023-08-05 DIAGNOSIS — Z961 Presence of intraocular lens: Secondary | ICD-10-CM | POA: Diagnosis not present

## 2023-09-08 ENCOUNTER — Other Ambulatory Visit: Payer: Self-pay | Admitting: Family Medicine

## 2023-09-08 DIAGNOSIS — Z1231 Encounter for screening mammogram for malignant neoplasm of breast: Secondary | ICD-10-CM

## 2023-10-07 DIAGNOSIS — M5459 Other low back pain: Secondary | ICD-10-CM | POA: Diagnosis not present

## 2023-10-14 ENCOUNTER — Ambulatory Visit
Admission: RE | Admit: 2023-10-14 | Discharge: 2023-10-14 | Disposition: A | Source: Ambulatory Visit | Attending: Family Medicine | Admitting: Family Medicine

## 2023-10-14 DIAGNOSIS — Z1231 Encounter for screening mammogram for malignant neoplasm of breast: Secondary | ICD-10-CM
